# Patient Record
Sex: Female | Born: 1965 | Race: White | Hispanic: No | Smoking: Never smoker
Health system: Southern US, Community
[De-identification: ages and names within clinical notes are randomized; demographics above are authoritative.]

## PROBLEM LIST (undated history)

## (undated) DIAGNOSIS — E785 Hyperlipidemia, unspecified: Secondary | ICD-10-CM

## (undated) DIAGNOSIS — N809 Endometriosis, unspecified: Secondary | ICD-10-CM

## (undated) DIAGNOSIS — K769 Liver disease, unspecified: Secondary | ICD-10-CM

## (undated) DIAGNOSIS — D493 Neoplasm of unspecified behavior of breast: Secondary | ICD-10-CM

## (undated) DIAGNOSIS — E039 Hypothyroidism, unspecified: Secondary | ICD-10-CM

## (undated) DIAGNOSIS — Z973 Presence of spectacles and contact lenses: Secondary | ICD-10-CM

## (undated) DIAGNOSIS — Z862 Personal history of diseases of the blood and blood-forming organs and certain disorders involving the immune mechanism: Secondary | ICD-10-CM

## (undated) DIAGNOSIS — Z8489 Family history of other specified conditions: Secondary | ICD-10-CM

## (undated) DIAGNOSIS — E559 Vitamin D deficiency, unspecified: Secondary | ICD-10-CM

## (undated) DIAGNOSIS — K760 Fatty (change of) liver, not elsewhere classified: Secondary | ICD-10-CM

## (undated) DIAGNOSIS — K8020 Calculus of gallbladder without cholecystitis without obstruction: Secondary | ICD-10-CM

## (undated) DIAGNOSIS — G43909 Migraine, unspecified, not intractable, without status migrainosus: Secondary | ICD-10-CM

## (undated) DIAGNOSIS — G4733 Obstructive sleep apnea (adult) (pediatric): Secondary | ICD-10-CM

## (undated) HISTORY — DX: Neoplasm of unspecified behavior of breast: D49.3

## (undated) HISTORY — DX: Calculus of gallbladder without cholecystitis without obstruction: K80.20

## (undated) HISTORY — DX: Hyperlipidemia, unspecified: E78.5

## (undated) HISTORY — DX: Vitamin D deficiency, unspecified: E55.9

## (undated) HISTORY — DX: Fatty (change of) liver, not elsewhere classified: K76.0

## (undated) HISTORY — PX: VAGINAL HYSTERECTOMY: SUR661

## (undated) HISTORY — DX: Liver disease, unspecified: K76.9

## (undated) HISTORY — DX: Obstructive sleep apnea (adult) (pediatric): G47.33

## (undated) HISTORY — DX: Migraine, unspecified, not intractable, without status migrainosus: G43.909

## (undated) HISTORY — PX: EXPLORATORY LAPAROTOMY: SUR591

## (undated) HISTORY — DX: Endometriosis, unspecified: N80.9

---

## 1993-07-27 HISTORY — PX: APPENDECTOMY: SHX54

## 1999-07-28 HISTORY — PX: THYROIDECTOMY: SHX17

## 2007-07-28 HISTORY — PX: BREAST LUMPECTOMY: SHX2

## 2009-04-30 ENCOUNTER — Encounter: Admission: RE | Admit: 2009-04-30 | Discharge: 2009-04-30 | Payer: Self-pay | Admitting: Obstetrics and Gynecology

## 2009-06-04 ENCOUNTER — Encounter: Admission: RE | Admit: 2009-06-04 | Discharge: 2009-06-04 | Payer: Self-pay | Admitting: Surgery

## 2009-06-04 ENCOUNTER — Encounter (INDEPENDENT_AMBULATORY_CARE_PROVIDER_SITE_OTHER): Payer: Self-pay | Admitting: Diagnostic Radiology

## 2009-07-04 ENCOUNTER — Encounter (INDEPENDENT_AMBULATORY_CARE_PROVIDER_SITE_OTHER): Payer: Self-pay | Admitting: Obstetrics and Gynecology

## 2009-07-04 ENCOUNTER — Ambulatory Visit (HOSPITAL_COMMUNITY): Admission: RE | Admit: 2009-07-04 | Discharge: 2009-07-05 | Payer: Self-pay | Admitting: Obstetrics and Gynecology

## 2009-07-04 HISTORY — PX: LAPAROSCOPIC ASSISTED VAGINAL HYSTERECTOMY: SHX5398

## 2009-07-11 ENCOUNTER — Inpatient Hospital Stay (HOSPITAL_COMMUNITY): Admission: AD | Admit: 2009-07-11 | Discharge: 2009-07-14 | Payer: Self-pay | Admitting: Obstetrics and Gynecology

## 2010-01-31 ENCOUNTER — Emergency Department (HOSPITAL_BASED_OUTPATIENT_CLINIC_OR_DEPARTMENT_OTHER): Admission: EM | Admit: 2010-01-31 | Discharge: 2010-01-31 | Payer: Self-pay | Admitting: Emergency Medicine

## 2010-01-31 ENCOUNTER — Ambulatory Visit: Payer: Self-pay | Admitting: Diagnostic Radiology

## 2010-02-14 IMAGING — US US BIOPSY
1 series · 13 of 16 positions shown · non-contrast
Comparison: none

Addendum Begins

Pathology reveals a fibroadenoma and benign breast parenchyma in
the left breast. This was found to be concordant by Dr. Thrwat
Pettang. The patient reported that she had done well after the biopsy
without any bleeding, bruising, or tenderness. Post biopsy
instructions were given and the patient was encouraged to call The
She was asked to return in1 year to [REDACTED] for her
annual screening mammogram.
Pathology results are dictated by Magdolin Ben Ahmed RN, BSN on Danii
Addendum Ends
CLINICAL DATA: Mass in the 2 o'clock position of the left breast,
favored to represent a fibroadenoma.  The patient and physician
requests biopsy.

[Series 1: us biopsy · 13 of 16 slices shown]
[im 1/16]
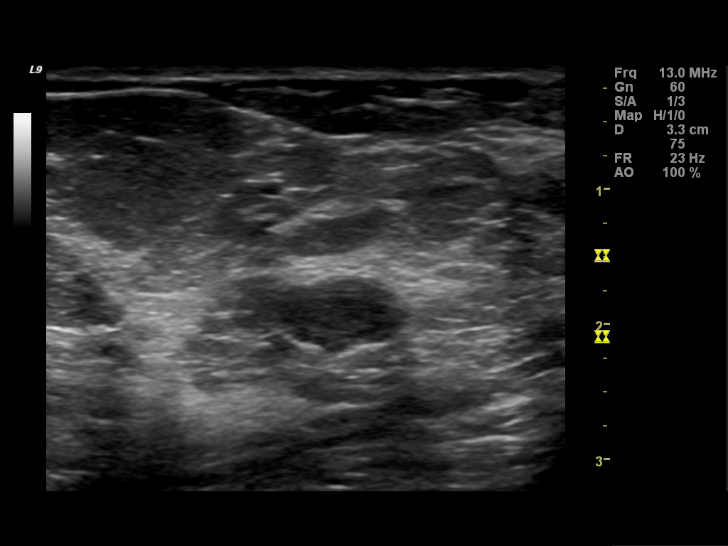
[im 2/16]
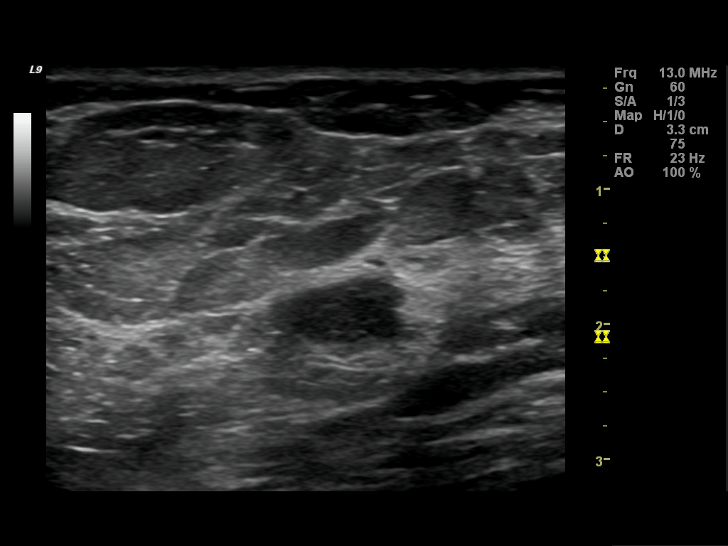
[im 4/16]
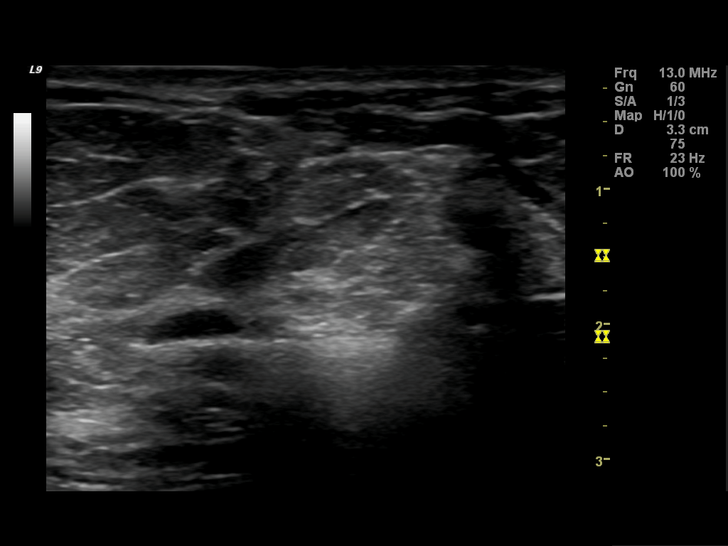
[im 5/16]
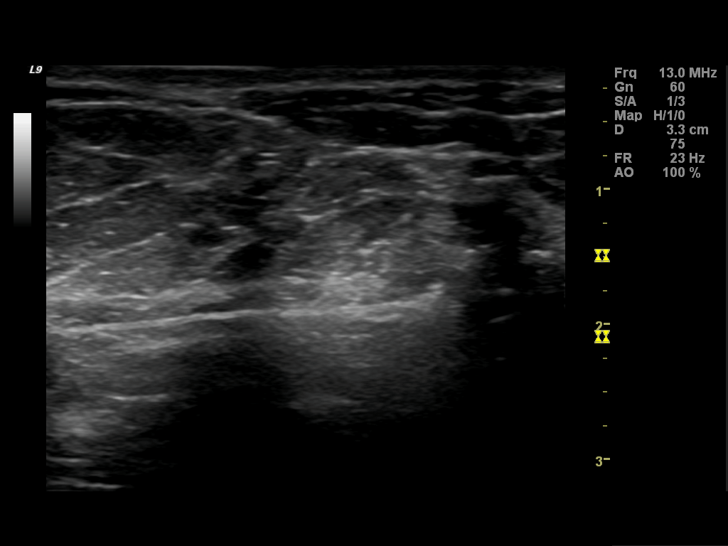
[im 6/16]
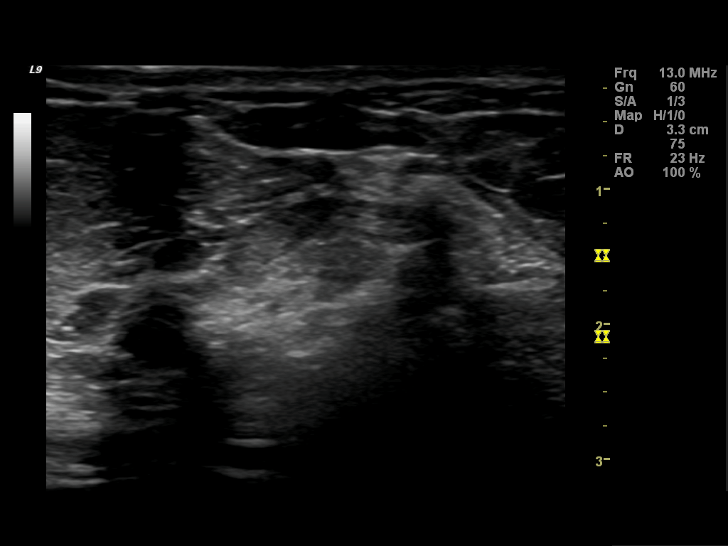
[im 7/16]
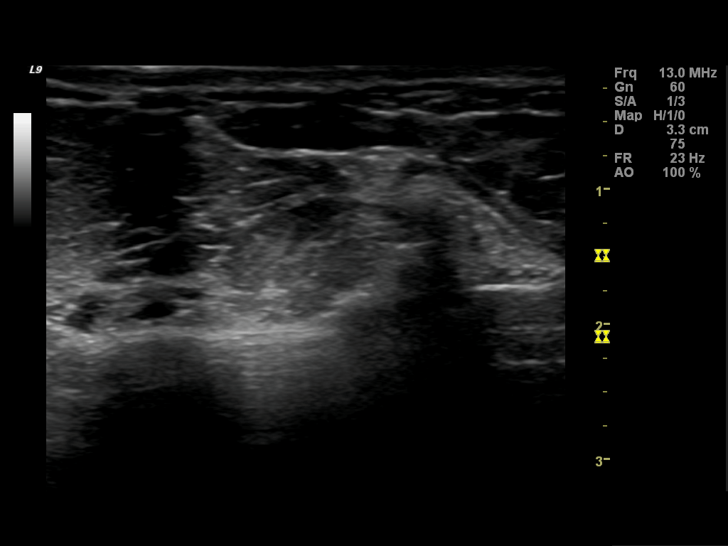
[im 9/16]
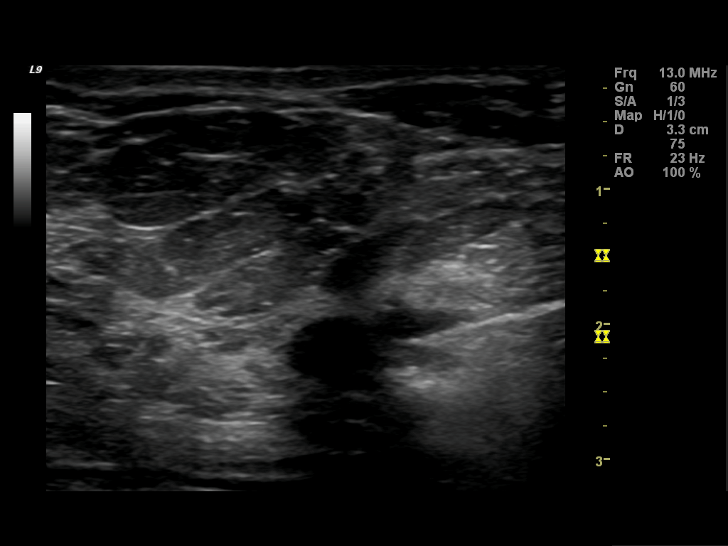
[im 10/16]
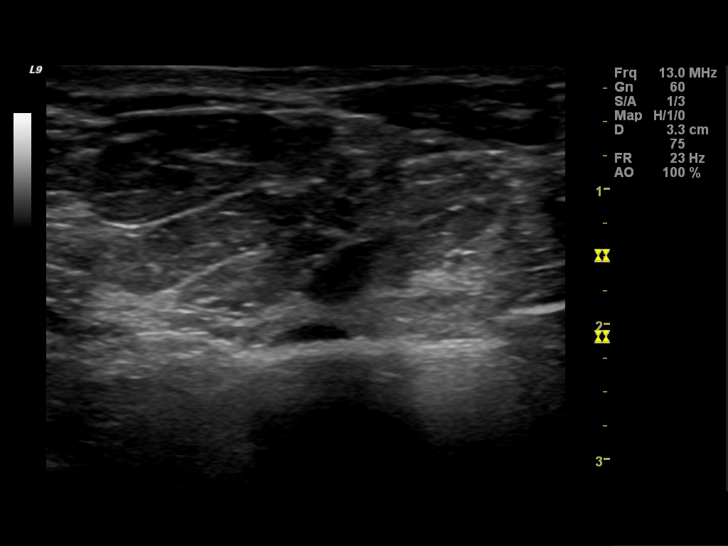
[im 11/16]
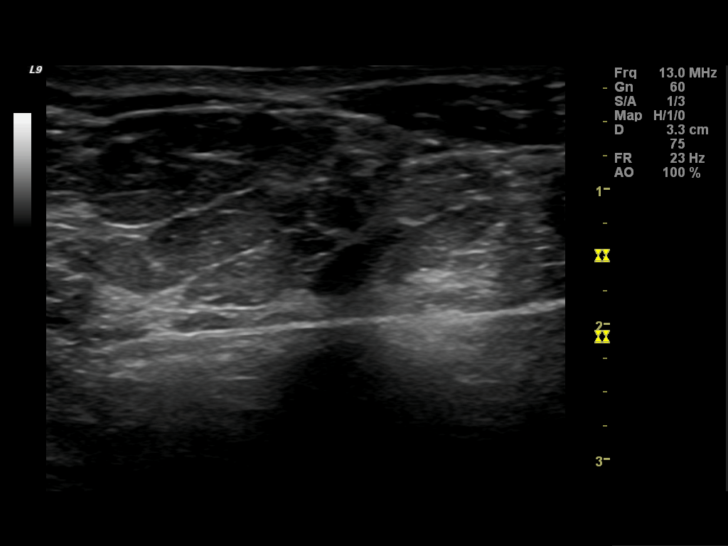
[im 12/16]
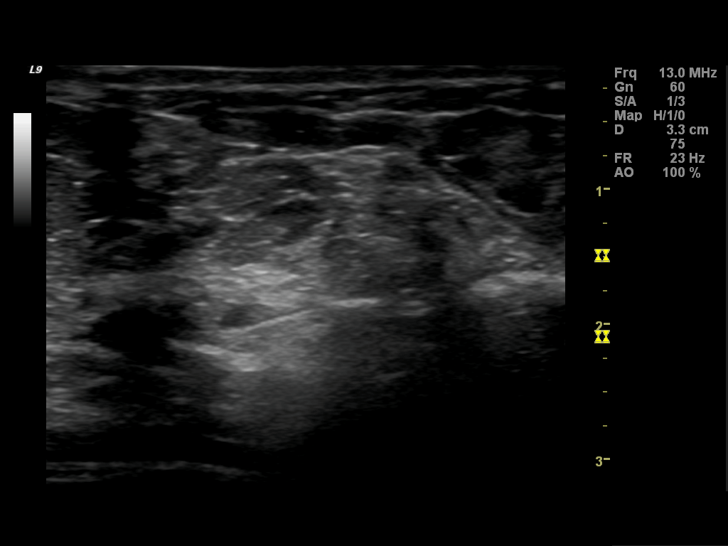
[im 13/16]
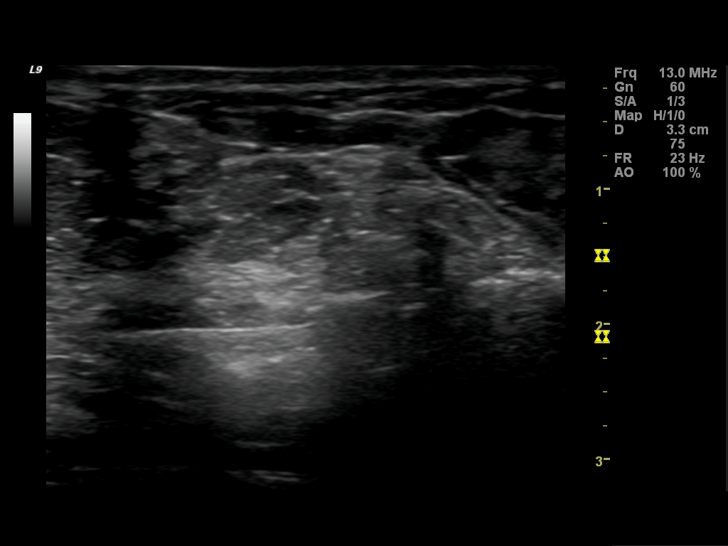
[im 15/16]
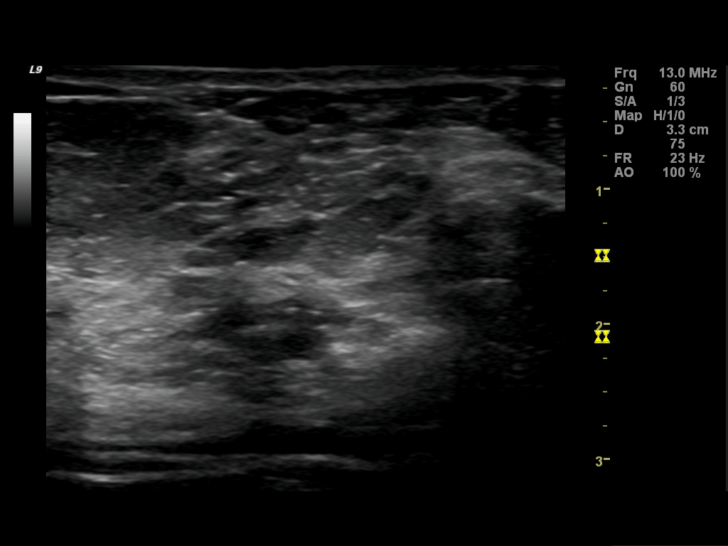
[im 16/16]
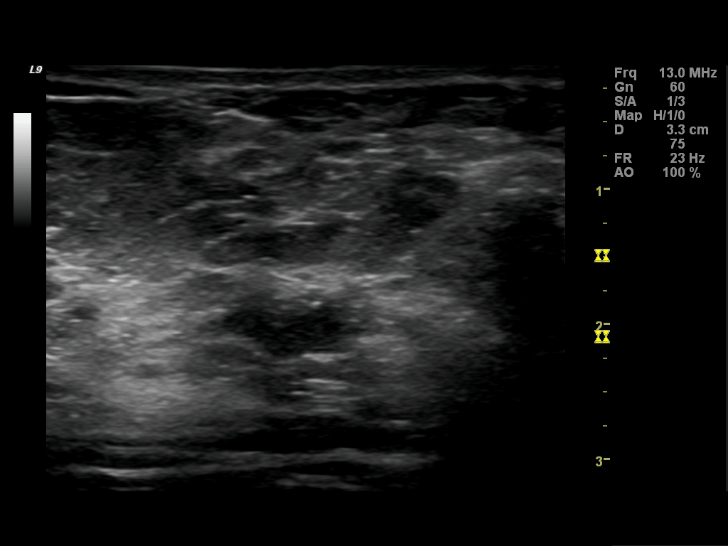

[13 of 16 positions shown; findings below may reference images not displayed]

ULTRASOUND GUIDED VACUUM ASSISTED CORE BIOPSY OF THE LEFT BREAST

I met with the patient, and we discussed the procedure of
ultrasound-guided biopsy, including risks, benefits, and
alternatives.  Specifically, we discussed the risks of infection,
bleeding, tissue injury, clip migration, and inadequate sampling.
Informed, written consent was given.

Using sterile technique, 2% lidocaine, ultrasound guidance, and a
12 gauge vacuum assisted needle, biopsy was performed of nodule in
the 2 o'clock position of the left breast, using a lateral
approach.  At the conclusion of the procedure, a tissue marker clip
was deployed into the biopsy cavity.  Follow-up 2-view mammogram
was performed and dictated separately.
IMPRESSION: Ultrasound-guided biopsy of nodule the 2 o'clock position left
breast.  No apparent complications.

## 2010-02-14 IMAGING — MG MM DIGITAL DIAGNOSTIC UNILAT*L*
2 series · 2 of 2 positions shown · non-contrast
Comparison: Pre biopsy mammogram and ultrasound

CLINICAL DATA: Status post ultrasound-guided core biopsy nodule in
the 2 o'clock position of the left breast.

DIGITAL DIAGNOSTIC LEFT MAMMOGRAM

[L CC]
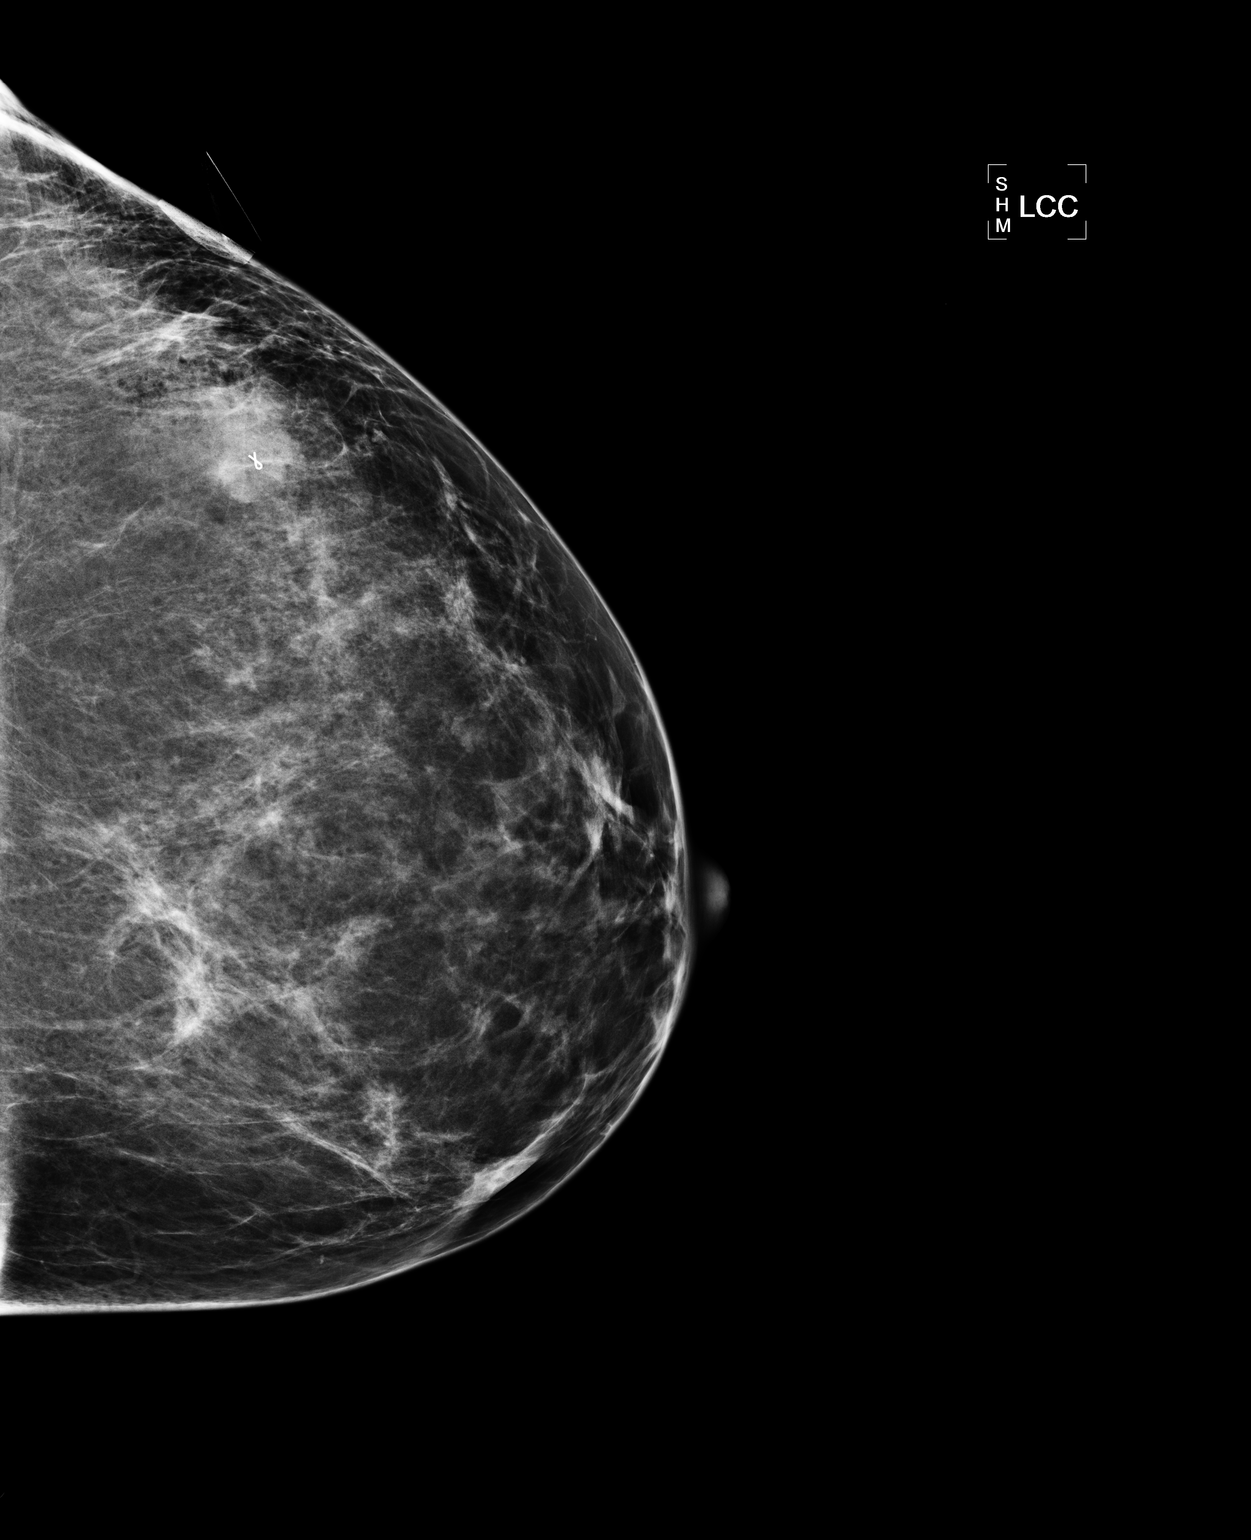

[L ML]
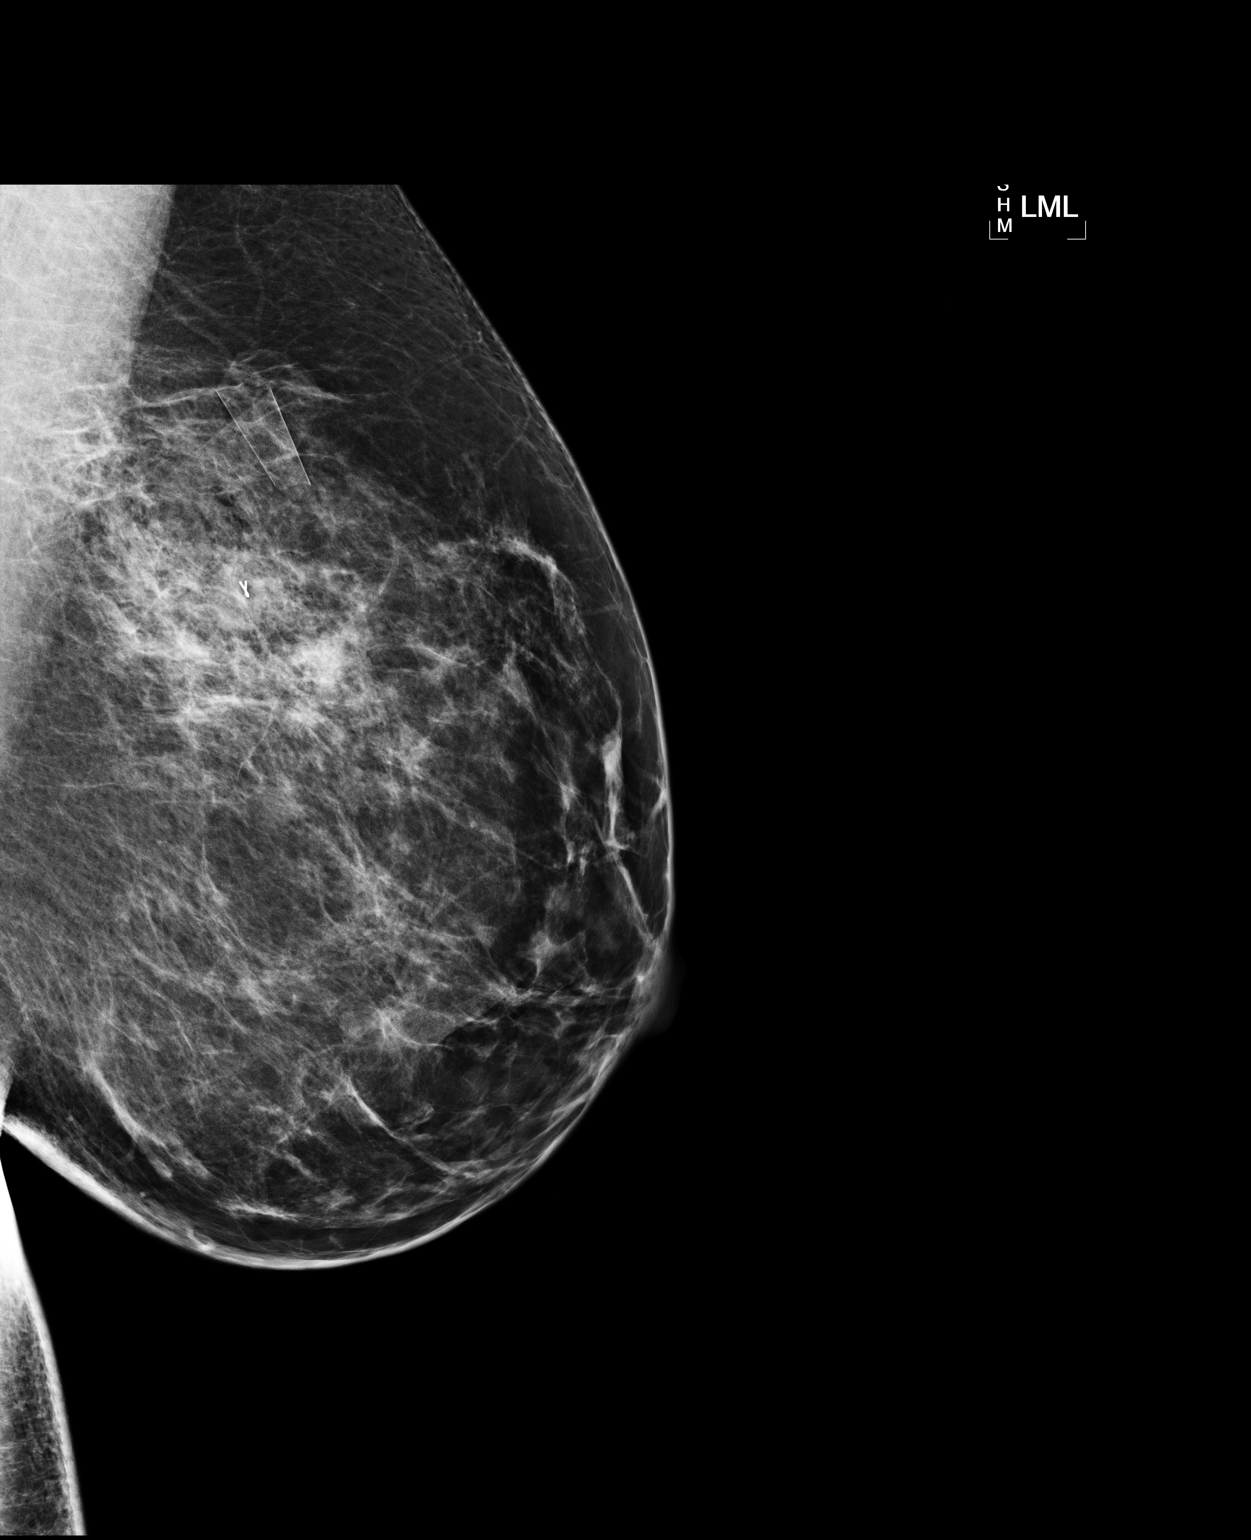

[2 of 2 positions shown; findings below may reference images not displayed]

FINDINGS: Films are performed following ultrasound guided biopsy
of nodule the 2 o'clock position of the left breast.  A ribbon
shaped clip is identified within the nodule the upper-outer
quadrant of the left breast, in the expected location following
biopsy.
IMPRESSION: Expected location of tissue marker clip.

## 2010-04-16 ENCOUNTER — Emergency Department (HOSPITAL_COMMUNITY): Admission: EM | Admit: 2010-04-16 | Discharge: 2010-04-16 | Payer: Self-pay | Admitting: Family Medicine

## 2010-04-30 ENCOUNTER — Encounter: Admission: RE | Admit: 2010-04-30 | Discharge: 2010-04-30 | Payer: Self-pay | Admitting: Surgery

## 2010-10-18 ENCOUNTER — Emergency Department (HOSPITAL_COMMUNITY)
Admission: EM | Admit: 2010-10-18 | Discharge: 2010-10-19 | Disposition: A | Discharge status: Home / Self Care | Payer: BC Managed Care – PPO | Attending: Emergency Medicine | Admitting: Emergency Medicine

## 2010-10-18 DIAGNOSIS — E039 Hypothyroidism, unspecified: Secondary | ICD-10-CM | POA: Insufficient documentation

## 2010-10-18 DIAGNOSIS — T148XXA Other injury of unspecified body region, initial encounter: Secondary | ICD-10-CM | POA: Insufficient documentation

## 2010-10-18 DIAGNOSIS — M79609 Pain in unspecified limb: Secondary | ICD-10-CM | POA: Insufficient documentation

## 2010-10-18 DIAGNOSIS — R269 Unspecified abnormalities of gait and mobility: Secondary | ICD-10-CM | POA: Insufficient documentation

## 2010-10-18 DIAGNOSIS — X500XXA Overexertion from strenuous movement or load, initial encounter: Secondary | ICD-10-CM | POA: Insufficient documentation

## 2010-10-27 LAB — COMPREHENSIVE METABOLIC PANEL
ALT: 18 U/L (ref 0–35)
AST: 15 U/L (ref 0–37)
Albumin: 3.4 g/dL — ABNORMAL LOW (ref 3.5–5.2)
Alkaline Phosphatase: 69 U/L (ref 39–117)
BUN: 11 mg/dL (ref 6–23)
CO2: 22 mEq/L (ref 19–32)
Calcium: 8.9 mg/dL (ref 8.4–10.5)
Chloride: 103 mEq/L (ref 96–112)
Creatinine, Ser: 0.87 mg/dL (ref 0.4–1.2)
GFR calc Af Amer: >60 mL/min (ref >60)
GFR calc non Af Amer: >60 mL/min (ref >60)
Glucose, Bld: 110 mg/dL — ABNORMAL HIGH (ref 70–99)
Potassium: 3.8 mEq/L (ref 3.5–5.1)
Sodium: 134 mEq/L — ABNORMAL LOW (ref 135–145)
Total Bilirubin: 1 mg/dL (ref 0.3–1.2)
Total Protein: 7.1 g/dL (ref 6.0–8.3)

## 2010-10-27 LAB — DIFFERENTIAL
Basophils Absolute: 0 10*3/uL (ref 0.0–0.1)
Basophils Absolute: 0 10*3/uL (ref 0.0–0.1)
Basophils Absolute: 0.1 10*3/uL (ref 0.0–0.1)
Basophils Relative: 0 % (ref 0–1)
Basophils Relative: 0 % (ref 0–1)
Basophils Relative: 0 % (ref 0–1)
Eosinophils Absolute: 0.1 10*3/uL (ref 0.0–0.7)
Eosinophils Absolute: 0.1 10*3/uL (ref 0.0–0.7)
Eosinophils Absolute: 0.2 10*3/uL (ref 0.0–0.7)
Eosinophils Relative: 1 % (ref 0–5)
Eosinophils Relative: 1 % (ref 0–5)
Eosinophils Relative: 2 % (ref 0–5)
Lymphocytes Relative: 18 % (ref 12–46)
Lymphocytes Relative: 4 % — ABNORMAL LOW (ref 12–46)
Lymphocytes Relative: 9 % — ABNORMAL LOW (ref 12–46)
Lymphs Abs: 0.9 10*3/uL (ref 0.7–4.0)
Lymphs Abs: 1.3 10*3/uL (ref 0.7–4.0)
Lymphs Abs: 1.7 10*3/uL (ref 0.7–4.0)
Monocytes Absolute: 0.9 10*3/uL (ref 0.1–1.0)
Monocytes Absolute: 1.3 10*3/uL — ABNORMAL HIGH (ref 0.1–1.0)
Monocytes Absolute: 1.5 10*3/uL — ABNORMAL HIGH (ref 0.1–1.0)
Monocytes Relative: 10 % (ref 3–12)
Monocytes Relative: 10 % (ref 3–12)
Monocytes Relative: 6 % (ref 3–12)
Neutro Abs: 11.8 10*3/uL — ABNORMAL HIGH (ref 1.7–7.7)
Neutro Abs: 18.8 10*3/uL — ABNORMAL HIGH (ref 1.7–7.7)
Neutro Abs: 6.6 10*3/uL (ref 1.7–7.7)
Neutrophils Relative %: 70 % (ref 43–77)
Neutrophils Relative %: 80 % — ABNORMAL HIGH (ref 43–77)
Neutrophils Relative %: 89 % — ABNORMAL HIGH (ref 43–77)

## 2010-10-27 LAB — URINALYSIS, ROUTINE W REFLEX MICROSCOPIC
Bilirubin Urine: NEGATIVE
Glucose, UA: NEGATIVE mg/dL
Ketones, ur: 40 mg/dL — AB
Leukocytes, UA: NEGATIVE
Nitrite: NEGATIVE
Protein, ur: NEGATIVE mg/dL
Specific Gravity, Urine: <1.005 — ABNORMAL LOW (ref 1.005–1.030)
Urobilinogen, UA: 0.2 mg/dL (ref 0.0–1.0)
pH: 5.5 (ref 5.0–8.0)

## 2010-10-27 LAB — D-DIMER, QUANTITATIVE (NOT AT ARMC): D-Dimer, Quant: 1.26 ug/mL-FEU — ABNORMAL HIGH (ref 0.00–0.48)

## 2010-10-27 LAB — CBC
HCT: 28.1 % — ABNORMAL LOW (ref 36.0–46.0)
HCT: 30 % — ABNORMAL LOW (ref 36.0–46.0)
HCT: 34.8 % — ABNORMAL LOW (ref 36.0–46.0)
Hemoglobin: 10 g/dL — ABNORMAL LOW (ref 12.0–15.0)
Hemoglobin: 11.6 g/dL — ABNORMAL LOW (ref 12.0–15.0)
Hemoglobin: 9.4 g/dL — ABNORMAL LOW (ref 12.0–15.0)
MCHC: 33.3 g/dL (ref 30.0–36.0)
MCHC: 33.3 g/dL (ref 30.0–36.0)
MCHC: 33.4 g/dL (ref 30.0–36.0)
MCV: 84.8 fL (ref 78.0–100.0)
MCV: 84.9 fL (ref 78.0–100.0)
MCV: 85.8 fL (ref 78.0–100.0)
Platelets: 348 10*3/uL (ref 150–400)
Platelets: 348 10*3/uL (ref 150–400)
Platelets: 391 10*3/uL (ref 150–400)
RBC: 3.31 MIL/uL — ABNORMAL LOW (ref 3.87–5.11)
RBC: 3.5 MIL/uL — ABNORMAL LOW (ref 3.87–5.11)
RBC: 4.1 MIL/uL (ref 3.87–5.11)
RDW: 14 % (ref 11.5–15.5)
RDW: 14.1 % (ref 11.5–15.5)
RDW: 14.3 % (ref 11.5–15.5)
WBC: 14.7 10*3/uL — ABNORMAL HIGH (ref 4.0–10.5)
WBC: 21.2 10*3/uL — ABNORMAL HIGH (ref 4.0–10.5)
WBC: 9.4 10*3/uL (ref 4.0–10.5)

## 2010-10-27 LAB — PROTIME-INR
INR: 1.06 (ref 0.00–1.49)
Prothrombin Time: 13.7 seconds (ref 11.6–15.2)

## 2010-10-27 LAB — CULTURE, BLOOD (ROUTINE X 2)
Culture: NO GROWTH
Culture: NO GROWTH

## 2010-10-27 LAB — URINE CULTURE
Colony Count: NO GROWTH
Culture: NO GROWTH

## 2010-10-27 LAB — BLOOD GAS, ARTERIAL
Acid-base deficit: 5 mmol/L — ABNORMAL HIGH (ref 0.0–2.0)
Bicarbonate: 17.2 mEq/L — ABNORMAL LOW (ref 20.0–24.0)
Drawn by: 139
FIO2: 0.21 %
TCO2: 17.9 mmol/L (ref 0–100)
pCO2 arterial: 24 mmHg — ABNORMAL LOW (ref 35.0–45.0)
pH, Arterial: 7.468 — ABNORMAL HIGH (ref 7.350–7.400)
pO2, Arterial: 32.9 mmHg — CL (ref 80.0–100.0)

## 2010-10-27 LAB — LACTIC ACID, PLASMA: Lactic Acid, Venous: 1 mmol/L (ref 0.5–2.2)

## 2010-10-27 LAB — TYPE AND SCREEN
ABO/RH(D): O POS
Antibody Screen: NEGATIVE

## 2010-10-27 LAB — ABO/RH: ABO/RH(D): O POS

## 2010-10-27 LAB — URINE MICROSCOPIC-ADD ON

## 2010-10-27 LAB — APTT: aPTT: 41 seconds — ABNORMAL HIGH (ref 24–37)

## 2010-10-28 LAB — T4: T4, Total: 16 ug/dL — ABNORMAL HIGH (ref 5.0–12.5)

## 2010-10-28 LAB — CBC
HCT: 32.9 % — ABNORMAL LOW (ref 36.0–46.0)
HCT: 40.2 % (ref 36.0–46.0)
Hemoglobin: 10.9 g/dL — ABNORMAL LOW (ref 12.0–15.0)
Hemoglobin: 13.3 g/dL (ref 12.0–15.0)
MCHC: 33.1 g/dL (ref 30.0–36.0)
MCHC: 33.2 g/dL (ref 30.0–36.0)
MCV: 85.7 fL (ref 78.0–100.0)
MCV: 86.8 fL (ref 78.0–100.0)
Platelets: 332 10*3/uL (ref 150–400)
Platelets: 384 10*3/uL (ref 150–400)
RBC: 3.8 MIL/uL — ABNORMAL LOW (ref 3.87–5.11)
RBC: 4.69 MIL/uL (ref 3.87–5.11)
RDW: 14.1 % (ref 11.5–15.5)
RDW: 14.6 % (ref 11.5–15.5)
WBC: 16.1 10*3/uL — ABNORMAL HIGH (ref 4.0–10.5)
WBC: 6.9 10*3/uL (ref 4.0–10.5)

## 2010-10-28 LAB — T3: T3, Total: 149.3 ng/dl (ref 80.0–204.0)

## 2010-10-28 LAB — TSH: TSH: 0.019 u[IU]/mL — ABNORMAL LOW (ref 0.350–4.500)

## 2010-10-28 LAB — VITAMIN D 1,25 DIHYDROXY
Vitamin D 1, 25 (OH)2 Total: 50 pg/mL (ref 18–72)
Vitamin D2 1, 25 (OH)2: 33 pg/mL
Vitamin D3 1, 25 (OH)2: 17 pg/mL

## 2012-07-27 DIAGNOSIS — K769 Liver disease, unspecified: Secondary | ICD-10-CM

## 2012-07-27 HISTORY — DX: Liver disease, unspecified: K76.9

## 2013-02-28 ENCOUNTER — Other Ambulatory Visit: Payer: Self-pay | Admitting: Orthopedic Surgery

## 2013-03-07 ENCOUNTER — Encounter (HOSPITAL_BASED_OUTPATIENT_CLINIC_OR_DEPARTMENT_OTHER): Payer: Self-pay | Admitting: *Deleted

## 2013-03-14 ENCOUNTER — Ambulatory Visit (HOSPITAL_BASED_OUTPATIENT_CLINIC_OR_DEPARTMENT_OTHER): Payer: Medicaid Other | Admitting: Certified Registered Nurse Anesthetist

## 2013-03-14 ENCOUNTER — Encounter (HOSPITAL_BASED_OUTPATIENT_CLINIC_OR_DEPARTMENT_OTHER): Payer: Self-pay | Admitting: Certified Registered Nurse Anesthetist

## 2013-03-14 ENCOUNTER — Encounter (HOSPITAL_BASED_OUTPATIENT_CLINIC_OR_DEPARTMENT_OTHER)
Admission: RE | Disposition: A | Discharge status: Home / Self Care | Payer: Self-pay | Source: Ambulatory Visit | Attending: Orthopedic Surgery

## 2013-03-14 ENCOUNTER — Ambulatory Visit (HOSPITAL_BASED_OUTPATIENT_CLINIC_OR_DEPARTMENT_OTHER)
Admission: RE | Admit: 2013-03-14 | Discharge: 2013-03-14 | Disposition: A | Discharge status: Home / Self Care | Payer: Medicaid Other | Source: Ambulatory Visit | Attending: Orthopedic Surgery | Admitting: Orthopedic Surgery

## 2013-03-14 DIAGNOSIS — D693 Immune thrombocytopenic purpura: Secondary | ICD-10-CM | POA: Insufficient documentation

## 2013-03-14 DIAGNOSIS — Z91013 Allergy to seafood: Secondary | ICD-10-CM | POA: Insufficient documentation

## 2013-03-14 DIAGNOSIS — M778 Other enthesopathies, not elsewhere classified: Secondary | ICD-10-CM | POA: Insufficient documentation

## 2013-03-14 DIAGNOSIS — Z885 Allergy status to narcotic agent status: Secondary | ICD-10-CM | POA: Insufficient documentation

## 2013-03-14 DIAGNOSIS — Z91041 Radiographic dye allergy status: Secondary | ICD-10-CM | POA: Insufficient documentation

## 2013-03-14 DIAGNOSIS — G5600 Carpal tunnel syndrome, unspecified upper limb: Secondary | ICD-10-CM | POA: Insufficient documentation

## 2013-03-14 DIAGNOSIS — E039 Hypothyroidism, unspecified: Secondary | ICD-10-CM | POA: Insufficient documentation

## 2013-03-14 HISTORY — DX: Personal history of diseases of the blood and blood-forming organs and certain disorders involving the immune mechanism: Z86.2

## 2013-03-14 HISTORY — DX: Family history of other specified conditions: Z84.89

## 2013-03-14 HISTORY — PX: CARPAL TUNNEL RELEASE: SHX101

## 2013-03-14 HISTORY — DX: Hypothyroidism, unspecified: E03.9

## 2013-03-14 LAB — POCT HEMOGLOBIN-HEMACUE: Hemoglobin: 13.8 g/dL (ref 12.0–15.0)

## 2013-03-14 SURGERY — CARPAL TUNNEL RELEASE
Anesthesia: General | Site: Wrist | Laterality: Right | Wound class: Clean

## 2013-03-14 MED ORDER — OXYCODONE-ACETAMINOPHEN 5-325 MG PO TABS: ORAL_TABLET | ORAL | Status: DC

## 2013-03-14 MED ORDER — OXYCODONE HCL 5 MG/5ML PO SOLN: 5.0000 mg | Freq: Once | ORAL | Status: AC | PRN

## 2013-03-14 MED ORDER — MIDAZOLAM HCL 2 MG/2ML IJ SOLN: 1.0000 mg | INTRAMUSCULAR | Status: DC | PRN

## 2013-03-14 MED ORDER — PROPOFOL 10 MG/ML IV BOLUS
INTRAVENOUS | Status: DC | PRN
  Administered 2013-03-14: 200 mg via INTRAVENOUS

## 2013-03-14 MED ORDER — FENTANYL CITRATE 0.05 MG/ML IJ SOLN: 50.0000 ug | INTRAMUSCULAR | Status: DC | PRN

## 2013-03-14 MED ORDER — DEXAMETHASONE SODIUM PHOSPHATE 10 MG/ML IJ SOLN
INTRAMUSCULAR | Status: DC | PRN
  Administered 2013-03-14: 10 mg via INTRAVENOUS

## 2013-03-14 MED ORDER — OXYCODONE HCL 5 MG PO TABS
5.0000 mg | ORAL_TABLET | Freq: Once | ORAL | Status: AC | PRN
  Administered 2013-03-14: 5 mg via ORAL

## 2013-03-14 MED ORDER — CHLORHEXIDINE GLUCONATE 4 % EX LIQD: 60.0000 mL | Freq: Once | CUTANEOUS | Status: DC

## 2013-03-14 MED ORDER — LIDOCAINE HCL (CARDIAC) 20 MG/ML IV SOLN
INTRAVENOUS | Status: DC | PRN
  Administered 2013-03-14: 60 mg via INTRAVENOUS

## 2013-03-14 MED ORDER — MIDAZOLAM HCL 2 MG/ML PO SYRP: 12.0000 mg | ORAL_SOLUTION | Freq: Once | ORAL | Status: DC | PRN

## 2013-03-14 MED ORDER — FENTANYL CITRATE 0.05 MG/ML IJ SOLN
INTRAMUSCULAR | Status: DC | PRN
  Administered 2013-03-14: 50 ug via INTRAVENOUS
  Administered 2013-03-14: 25 ug via INTRAVENOUS
  Administered 2013-03-14: 50 ug via INTRAVENOUS

## 2013-03-14 MED ORDER — HYDROMORPHONE HCL PF 1 MG/ML IJ SOLN
0.2500 mg | INTRAMUSCULAR | Status: DC | PRN
  Administered 2013-03-14 (×2): 0.5 mg via INTRAVENOUS

## 2013-03-14 MED ORDER — ONDANSETRON HCL 4 MG/2ML IJ SOLN
INTRAMUSCULAR | Status: DC | PRN
  Administered 2013-03-14: 4 mg via INTRAVENOUS

## 2013-03-14 MED ORDER — LIDOCAINE HCL 2 % IJ SOLN
INTRAMUSCULAR | Status: DC | PRN
  Administered 2013-03-14: 4 mL

## 2013-03-14 MED ORDER — MIDAZOLAM HCL 5 MG/5ML IJ SOLN
INTRAMUSCULAR | Status: DC | PRN
  Administered 2013-03-14: 1 mg via INTRAVENOUS

## 2013-03-14 MED ORDER — LACTATED RINGERS IV SOLN
INTRAVENOUS | Status: DC
  Administered 2013-03-14 (×2): via INTRAVENOUS

## 2013-03-14 SURGICAL SUPPLY — 37 items
BANDAGE ADHESIVE 1X3 (GAUZE/BANDAGES/DRESSINGS) IMPLANT
BANDAGE ELASTIC 3 VELCRO ST LF (GAUZE/BANDAGES/DRESSINGS) ×2 IMPLANT
BLADE SURG 15 STRL LF DISP TIS (BLADE) ×1 IMPLANT
BLADE SURG 15 STRL SS (BLADE) ×1
BNDG CMPR 9X4 STRL LF SNTH (GAUZE/BANDAGES/DRESSINGS) ×1
BNDG ESMARK 4X9 LF (GAUZE/BANDAGES/DRESSINGS) ×2 IMPLANT
BRUSH SCRUB EZ PLAIN DRY (MISCELLANEOUS) ×2 IMPLANT
CLOTH BEACON ORANGE TIMEOUT ST (SAFETY) ×2 IMPLANT
CORDS BIPOLAR (ELECTRODE) ×2 IMPLANT
COVER MAYO STAND STRL (DRAPES) ×2 IMPLANT
COVER TABLE BACK 60X90 (DRAPES) ×2 IMPLANT
CUFF TOURNIQUET SINGLE 18IN (TOURNIQUET CUFF) ×2 IMPLANT
DECANTER SPIKE VIAL GLASS SM (MISCELLANEOUS) ×2 IMPLANT
DRAPE EXTREMITY T 121X128X90 (DRAPE) ×2 IMPLANT
DRAPE SURG 17X23 STRL (DRAPES) ×2 IMPLANT
GLOVE BIO SURGEON STRL SZ 6.5 (GLOVE) ×2 IMPLANT
GLOVE BIOGEL M STRL SZ7.5 (GLOVE) IMPLANT
GLOVE ORTHO TXT STRL SZ7.5 (GLOVE) ×2 IMPLANT
GOWN BRE IMP PREV XXLGXLNG (GOWN DISPOSABLE) ×2 IMPLANT
GOWN PREVENTION PLUS XLARGE (GOWN DISPOSABLE) ×2 IMPLANT
NEEDLE 27GAX1X1/2 (NEEDLE) ×2 IMPLANT
PACK BASIN DAY SURGERY FS (CUSTOM PROCEDURE TRAY) ×2 IMPLANT
PAD CAST 3X4 CTTN HI CHSV (CAST SUPPLIES) ×1 IMPLANT
PADDING CAST ABS 4INX4YD NS (CAST SUPPLIES) ×1
PADDING CAST ABS COTTON 4X4 ST (CAST SUPPLIES) ×1 IMPLANT
PADDING CAST COTTON 3X4 STRL (CAST SUPPLIES) ×1
SPLINT PLASTER CAST XFAST 3X15 (CAST SUPPLIES) ×5 IMPLANT
SPLINT PLASTER XTRA FASTSET 3X (CAST SUPPLIES) ×5
SPONGE GAUZE 4X4 12PLY (GAUZE/BANDAGES/DRESSINGS) ×2 IMPLANT
STOCKINETTE 4X48 STRL (DRAPES) ×2 IMPLANT
STRIP CLOSURE SKIN 1/2X4 (GAUZE/BANDAGES/DRESSINGS) ×2 IMPLANT
SUT PROLENE 3 0 PS 2 (SUTURE) ×2 IMPLANT
SYR 3ML 23GX1 SAFETY (SYRINGE) IMPLANT
SYR CONTROL 10ML LL (SYRINGE) ×2 IMPLANT
TOWEL OR 17X24 6PK STRL BLUE (TOWEL DISPOSABLE) ×2 IMPLANT
TRAY DSU PREP LF (CUSTOM PROCEDURE TRAY) ×2 IMPLANT
UNDERPAD 30X30 INCONTINENT (UNDERPADS AND DIAPERS) ×2 IMPLANT

## 2013-03-14 NOTE — H&P (Deleted)
Miranda [name], [name]                 ACCOUNT NO.:  [id number]  MEDICAL RECORD NO.:  [id number]  LOCATION:                               FACILITY:  [facility]  PHYSICIAN:  Katy Fitch. Joeanthony Seeling, M.D. DATE OF BIRTH:  November 07, 1965  DATE OF ADMISSION:  03/14/2013 DATE OF DISCHARGE:  03/14/2013                             HISTORY & PHYSICAL   PREOPERATIVE DIAGNOSES:  Chronic median entrapment neuropathy, right carpal tunnel rule out possible cyst adjacent to flexor pollicis longus, with positive electrodiagnostic studies preoperatively documenting carpal tunnel syndrome.  POSTOPERATIVE DIAGNOSES:  Chronic median entrapment neuropathy, right carpal tunnel rule out possible cyst adjacent to flexor pollicis longus, with positive electrodiagnostic studies preoperatively documenting carpal tunnel syndrome.  OPERATION:  Exploration of right carpal canal identifying carpal tunnel syndrome and hypertrophic bursa.  No evidence of a myxoid cyst.  OPERATING SURGEON:  Katy Fitch. Batul Diego, M.D.  ASSISTANT:  Surgical technician.  ANESTHESIA:  General by LMA.  SUPERVISING ANESTHESIOLOGIST:  Zenon Mayo, MD.  INDICATIONS:  Miranda [name] is a 47 year old woman referred through the courtesy of Miranda family for evaluation and management of hand numbness and pain.  Clinical examination suggested carpal tunnel syndrome and a possible space-occupying lesion at the wrist.  Due to a persistent symptoms, that did not respond to anti-inflammatory medication and steroid injection.  We sent Miranda for an MRI.  The MRI revealed a enhancing median nerve suggestive of swelling.  There was some fullness near the flexor pollicis longus which I questioned might be a myxoid cyst.  The attending radiologist felt that this was a unusually placed median nerve that was quite edematous.  We advised expiation of the carpal canal.  She presents for more definitive care at this time. Preoperatively, we reviewed Miranda multiple drug  allergies which included iodinated contrast agent, shellfish, and Demerol.  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  Questions were invited and answered in detail with Miranda [name] and Miranda [name], who was present with Miranda in the holding area preoperatively.  Dr. Sampson Goon detailed anesthesia choices.  General anesthesia by LMA technique was recommended and accepted.  After informed consent, she is brought to the operating room at this time.  PROCEDURE:  Miranda [name] was brought to room 2 of the [facility] and placed in a supine position on the operating table.  Following the induction of general anesthesia by LMA technique, the right hand and arm were prepped with DuraPrep due to a iodinated agent allergy.  A pneumatic tourniquet was applied to the proximal right brachium.  Sterile stockinette and impervious arthroscopy drapes were applied, followed by exsanguination of the right hand and arm with an Esmarch bandage and an inflation of the arterial tourniquet to 220 mmHg.  A routine surgical time-out was accomplished, followed by the procedure.  A short incision was fashioned in line of the ring finger and the palm. Subcutaneous tissues were carefully divided revealing the palmar fascia. This split longitudinally to the common sensory branch of the median nerve.  These were followed back to the transverse carpal ligament.  Miranda [name] was noted to have very typical hypothenar and  thenar musculature. The hypothenar muscles and thenar muscles interdigitated with layers of fascia.  Great care was taken to sound the canal with a Penfield 4 elevator, followed by teasing the muscle fibers apart looking for aberrant motor branches.  None were identified.  Ultimately, we proceeded with a sequential release of the transverse carpal ligament using tenotomy scissors into the distal forearm.  The volar forearm fascia was released subcutaneously.  This  widely opened the carpal canal.  The ulnar bursa was very thick and fibrotic and opaque.  With great care, a Penfield 4 elevator was used to explore between the tendons of the carpal canal, including retraction of the median nerve gently in an ulnar direction.  The flexor pollicis longus was inspected and no cyst or other mass was identified deep to the nerve.  It appeared that what we had seen on the MRI was indeed a very edematous median nerve, which was more radial and deeper than usual.  The wound was inspected for bleeding points which were electrocauterized bipolar current, followed by repair of the skin with intradermal 3-0 Prolene suture.  Steri-Strips was applied, followed by infiltration of the wound margin with 2% lidocaine for postoperative comfort.  The tourniquet was released with immediate capillary refill.  Miranda [name] was placed in a compressive dressing with a volar plaster splint maintaining the wrist in 15 degrees dorsiflexion.     Katy Fitch Galileah Piggee, M.D.   ______________________________ Katy Fitch. Sadie Pickar, M.D.    RVS/MEDQ  D:  03/14/2013  T:  03/14/2013  Job:  [id number]

## 2013-03-14 NOTE — Brief Op Note (Signed)
03/14/2013  9:20 AM  PATIENT:  Miranda [name]  47 y.o. female  PRE-OPERATIVE DIAGNOSIS:  RIGHT CARPAL TUNNEL SYNDROME, RULE OUT CYST @ CARPAL  POST-OPERATIVE DIAGNOSIS:  right carpal tunnel syndrome / bursitis  PROCEDURE:  Procedure(s): EXPLORE CANAL RIGHT CARPAL (Right)  SURGEON:  Surgeon(s) and Role:    * Wyn Forster., MD - Primary  PHYSICIAN ASSISTANT:   ASSISTANTS: Surgical technician  ANESTHESIA:   general  EBL:     BLOOD ADMINISTERED:none  DRAINS: none   LOCAL MEDICATIONS USED:  XYLOCAINE   SPECIMEN:  No Specimen  DISPOSITION OF SPECIMEN:  N/A  COUNTS:  YES  TOURNIQUET:   Total Tourniquet Time Documented: Upper Arm (Right) - 14 minutes Total: Upper Arm (Right) - 14 minutes   DICTATION: .Other Dictation: Dictation Number [id number]  PLAN OF CARE: Discharge to home after PACU  PATIENT DISPOSITION:  PACU - hemodynamically stable.   Delay start of Pharmacological VTE agent (>24hrs) due to surgical blood loss or risk of bleeding: not applicable

## 2013-03-14 NOTE — Op Note (Signed)
[id number]

## 2013-03-14 NOTE — H&P (Signed)
Miranda [name] is an 47 y.o. female.   Chief Complaint: right hand numbness HPI: 47 year old woman with right hand numbness.  Positive NCV documenting right carpal tunnel syndrome.  Past Medical History  Diagnosis Date  . History of idiopathic thrombocytopenic purpura     age 51 - no problems since  . Hypothyroidism   . Personal history of goiter   . 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 Family history of anesthesia complication     pt's mother has hx. of being hard to wake up post-op  . Dental crown present     Past Surgical History  Procedure Laterality Date  . Cesarean section      twins  . Thyroidectomy    . Breast lumpectomy Left   . Exploratory laparotomy    . Appendectomy      open appy.  . Laparoscopic assisted vaginal hysterectomy  07/04/2009    Family History  Problem Relation Age of Onset  . Anesthesia problems Mother     hard to wake up post-op   Social History:  reports that she has never smoked. She has never used smokeless tobacco. She reports that she does not drink alcohol or use illicit drugs.  Allergies:  Allergies  Allergen Reactions  . Iodinated Diagnostic Agents Anaphylaxis  . Shellfish Allergy Anaphylaxis  . Demerol [Meperidine] Other (See Comments)    HALLUCINATIONS    No prescriptions prior to admission    No results found for this or any previous visit (from the past 48 hour(s)). No results found.  Review of Systems  Constitutional: Negative.   HENT: Negative.   Eyes: Negative.   Respiratory: Negative.   Cardiovascular: Negative.   Gastrointestinal: Negative.   Genitourinary: Negative.   Musculoskeletal:       Episodic numbness of right hand in median innervated fingers.  MRI shos swollen median nerve and possible synovial cyst of carpal canal near FPL.  Skin: Negative.   Neurological: Negative.   Endo/Heme/Allergies:       History of ITP 1970.  Psychiatric/Behavioral: Negative.     Height 5\' 7"  (1.702 m), weight  90.719 kg (200 lb). Physical Exam  Constitutional: She is oriented to person, place, and time. She appears well-developed and well-nourished.  HENT:  Head: Normocephalic and atraumatic.  Eyes: EOM are normal. Pupils are equal, round, and reactive to light.  Neck: Normal range of motion. Neck supple.  Cardiovascular: Normal rate, regular rhythm and normal heart sounds.   Respiratory: Effort normal and breath sounds normal.  GI: Soft. Bowel sounds are normal.  Musculoskeletal: Normal range of motion.  Neurological: She is alert and oriented to person, place, and time.  Numbness in median innervated fingers right hand. NCV 12/21/12 revealed right median neuropathy. No response to injection or anti inflammatory med course.  Skin: Skin is warm and dry.  Psychiatric: She has a normal mood and affect. Her behavior is normal. Judgment and thought content normal.     Assessment/Plan: Right carpal tunnel syndrome.  Right carpal tunnel release.  Surgery ,aftercare, risks and benefits detailed.   Questions invited and answered.  Ayauna Mcnay JR,Heidee Audi V 03/14/2013, 6:24 AM

## 2013-03-14 NOTE — Anesthesia Preprocedure Evaluation (Addendum)
Anesthesia Evaluation  Patient identified by MRN, date of birth, ID band Patient awake    Reviewed: Allergy & Precautions, NPO status   Airway Mallampati: II TM Distance: >3 FB Neck ROM: Full    Dental  (+) Teeth Intact and Dental Advisory Given   Pulmonary  breath sounds clear to auscultation        Cardiovascular Rhythm:Regular Rate:Normal     Neuro/Psych  Neuromuscular disease    GI/Hepatic   Endo/Other  Hypothyroidism   Renal/GU      Musculoskeletal   Abdominal   Peds  Hematology   Anesthesia Other Findings   Reproductive/Obstetrics                          Anesthesia Physical Anesthesia Plan  ASA: II  Anesthesia Plan: General   Post-op Pain Management:    Induction: Intravenous  Airway Management Planned: LMA  Additional Equipment:   Intra-op Plan:   Post-operative Plan: Extubation in OR  Informed Consent: I have reviewed the patients History and Physical, chart, labs and discussed the procedure including the risks, benefits and alternatives for the proposed anesthesia with the patient or authorized representative who has indicated his/her understanding and acceptance.   Dental advisory given  Plan Discussed with: CRNA  Anesthesia Plan Comments:         Anesthesia Quick Evaluation

## 2013-03-14 NOTE — Anesthesia Postprocedure Evaluation (Signed)
  Anesthesia Post-op Note  Patient: Miranda [name]  Procedure(s) Performed: Procedure(s): EXPLORE CANAL RIGHT CARPAL (Right)  Patient Location: PACU  Anesthesia Type:General  Level of Consciousness: awake and alert   Airway and Oxygen Therapy: Patient Spontanous Breathing  Post-op Pain: mild  Post-op Assessment: Post-op Vital signs reviewed, Patient's Cardiovascular Status Stable, Respiratory Function Stable, Patent Airway and No signs of Nausea or vomiting  Post-op Vital Signs: Reviewed and stable  Complications: No apparent anesthesia complications

## 2013-03-14 NOTE — Anesthesia Procedure Notes (Signed)
Procedure Name: LMA Insertion Date/Time: 03/14/2013 8:53 AM Performed by: Deniah Saia D Pre-anesthesia Checklist: Patient identified, Emergency Drugs available, Suction available and Patient being monitored Patient Re-evaluated:Patient Re-evaluated prior to inductionOxygen Delivery Method: Circle System Utilized Preoxygenation: Pre-oxygenation with 100% oxygen Intubation Type: IV induction Ventilation: Mask ventilation without difficulty LMA: LMA inserted LMA Size: 4.0 Number of attempts: 1 Airway Equipment and Method: bite block Placement Confirmation: positive ETCO2 Tube secured with: Tape Dental Injury: Teeth and Oropharynx as per pre-operative assessment

## 2013-03-14 NOTE — Transfer of Care (Signed)
Immediate Anesthesia Transfer of Care Note  Patient: Miranda [name]  Procedure(s) Performed: Procedure(s): EXPLORE CANAL RIGHT CARPAL (Right)  Patient Location: PACU  Anesthesia Type:General  Level of Consciousness: awake, alert , oriented and patient cooperative  Airway & Oxygen Therapy: Patient Spontanous Breathing and Patient connected to face mask oxygen  Post-op Assessment: Report given to PACU RN and Post -op Vital signs reviewed and stable  Post vital signs: Reviewed and stable  Complications: No apparent anesthesia complications

## 2013-03-15 ENCOUNTER — Encounter (HOSPITAL_BASED_OUTPATIENT_CLINIC_OR_DEPARTMENT_OTHER): Payer: Self-pay | Admitting: Orthopedic Surgery

## 2013-03-16 NOTE — Op Note (Signed)
[name], [name]                 ACCOUNT NO.:  [id number]  MEDICAL RECORD NO.:  [id number]  LOCATION:                               FACILITY:  [facility]  PHYSICIAN:  Katy Fitch. Dema Timmons, M.D. DATE OF BIRTH:  1966/03/27  DATE OF PROCEDURE:  03/14/2013 DATE OF DISCHARGE:  03/14/2013                              OPERATIVE REPORT   PREOPERATIVE DIAGNOSES:  Chronic median entrapment neuropathy, right carpal tunnel rule out possible cyst adjacent to flexor pollicis longus, with positive electrodiagnostic studies preoperatively documenting carpal tunnel syndrome.  POSTOPERATIVE DIAGNOSES:  Chronic median entrapment neuropathy, right carpal tunnel rule out possible cyst adjacent to flexor pollicis longus, with positive electrodiagnostic studies preoperatively documenting carpal tunnel syndrome.  OPERATION:  Exploration of right carpal canal identifying carpal tunnel syndrome and hypertrophic bursa.  No evidence of a myxoid cyst.  OPERATING SURGEON:  Katy Fitch. Zenna Traister, M.D.  ASSISTANT:  Surgical technician.  ANESTHESIA:  General by LMA.  SUPERVISING ANESTHESIOLOGIST:  Zenon Mayo, MD.  INDICATIONS:  Miranda [name] is a 47 year old woman referred through the courtesy of her family for evaluation and management of hand numbness and pain.  Clinical examination suggested carpal tunnel syndrome and a possible space-occupying lesion at the wrist.  Due to a persistent symptoms, that did not respond to anti-inflammatory medication and steroid injection.  We sent her for an MRI.  The MRI revealed a enhancing median nerve suggestive of swelling.  There was some fullness near the flexor pollicis longus which I questioned might be a myxoid cyst.  The attending radiologist felt that this was a unusually placed median nerve that was quite edematous.  We advised expiation of the carpal canal.  She presents for more definitive care at this time. Preoperatively, we reviewed her multiple drug  allergies which included iodinated contrast agent, shellfish, and Demerol.  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  Questions were invited and answered in detail with Miranda [name] and her mother, who was present with her in the holding area preoperatively.  Dr. Sampson Goon detailed anesthesia choices.  General anesthesia by LMA technique was recommended and accepted.  After informed consent, she is brought to the operating room at this time.  PROCEDURE:  Miranda [name] was brought to room 2 of the [facility] and placed in a supine position on the operating table.  Following the induction of general anesthesia by LMA technique, the right hand and arm were prepped with DuraPrep due to a iodinated agent allergy.  A pneumatic tourniquet was applied to the proximal right brachium.  Sterile stockinette and impervious arthroscopy drapes were applied, followed by exsanguination of the right hand and arm with an Esmarch bandage and an inflation of the arterial tourniquet to 220 mmHg.  A routine surgical time-out was accomplished, followed by the procedure.  A short incision was fashioned in line of the ring finger and the palm. Subcutaneous tissues were carefully divided revealing the palmar fascia. This split longitudinally to the common sensory branch of the median nerve.  These were followed back to the transverse carpal ligament.  Miranda [name] was noted to have very typical hypothenar and  thenar musculature. The hypothenar muscles and thenar muscles interdigitated with layers of fascia.  Great care was taken to sound the canal with a Penfield 4 elevator, followed by teasing the muscle fibers apart looking for aberrant motor branches.  None were identified.  Ultimately, we proceeded with a sequential release of the transverse carpal ligament using tenotomy scissors into the distal forearm.  The volar forearm fascia was released subcutaneously.  This  widely opened the carpal canal.  The ulnar bursa was very thick and fibrotic and opaque.  With great care, a Penfield 4 elevator was used to explore between the tendons of the carpal canal, including retraction of the median nerve gently in an ulnar direction.  The flexor pollicis longus was inspected and no cyst or other mass was identified deep to the nerve.  It appeared that what we had seen on the MRI was indeed a very edematous median nerve, which was more radial and deeper than usual.  The wound was inspected for bleeding points which were electrocauterized bipolar current, followed by repair of the skin with intradermal 3-0 Prolene suture.  Steri-Strips was applied, followed by infiltration of the wound margin with 2% lidocaine for postoperative comfort.  The tourniquet was released with immediate capillary refill.  Miranda [name] was placed in a compressive dressing with a volar plaster splint maintaining the wrist in 15 degrees dorsiflexion.     Katy Fitch Artice Bergerson, M.D.     RVS/MEDQ  D:  03/14/2013  T:  03/14/2013  Job:  [id number]

## 2013-05-01 ENCOUNTER — Other Ambulatory Visit (HOSPITAL_COMMUNITY): Payer: Self-pay | Admitting: Gastroenterology

## 2013-05-01 DIAGNOSIS — R109 Unspecified abdominal pain: Secondary | ICD-10-CM

## 2013-05-17 ENCOUNTER — Encounter (HOSPITAL_COMMUNITY)
Admission: RE | Admit: 2013-05-17 | Discharge: 2013-05-17 | Disposition: A | Discharge status: Home / Self Care | Payer: Medicaid Other | Source: Ambulatory Visit | Attending: Gastroenterology | Admitting: Gastroenterology

## 2013-05-17 DIAGNOSIS — R109 Unspecified abdominal pain: Secondary | ICD-10-CM | POA: Insufficient documentation

## 2013-05-17 MED ORDER — TECHNETIUM TC 99M MEBROFENIN IV KIT
5.4000 | PACK | Freq: Once | INTRAVENOUS | Status: AC | PRN
  Administered 2013-05-17: 5 via INTRAVENOUS

## 2013-05-17 MED ORDER — FLUDEOXYGLUCOSE F - 18 (FDG) INJECTION: 18.5000 | Freq: Once | INTRAVENOUS | Status: AC | PRN

## 2013-05-27 DIAGNOSIS — K760 Fatty (change of) liver, not elsewhere classified: Secondary | ICD-10-CM

## 2013-05-27 HISTORY — DX: Fatty (change of) liver, not elsewhere classified: K76.0

## 2013-05-31 ENCOUNTER — Other Ambulatory Visit: Payer: Self-pay | Admitting: Gastroenterology

## 2013-05-31 DIAGNOSIS — R109 Unspecified abdominal pain: Secondary | ICD-10-CM

## 2013-06-07 ENCOUNTER — Other Ambulatory Visit: Payer: BC Managed Care – PPO

## 2013-06-09 ENCOUNTER — Ambulatory Visit
Admission: RE | Admit: 2013-06-09 | Discharge: 2013-06-09 | Disposition: A | Discharge status: Home / Self Care | Payer: Medicaid Other | Source: Ambulatory Visit | Attending: Gastroenterology | Admitting: Gastroenterology

## 2013-06-09 DIAGNOSIS — R109 Unspecified abdominal pain: Secondary | ICD-10-CM

## 2013-06-09 MED ORDER — GADOBENATE DIMEGLUMINE 529 MG/ML IV SOLN
20.0000 mL | Freq: Once | INTRAVENOUS | Status: AC | PRN
  Administered 2013-06-09: 20 mL via INTRAVENOUS

## 2013-06-10 ENCOUNTER — Other Ambulatory Visit: Payer: Self-pay | Admitting: Gastroenterology

## 2013-07-10 ENCOUNTER — Encounter (INDEPENDENT_AMBULATORY_CARE_PROVIDER_SITE_OTHER): Payer: Self-pay | Admitting: Surgery

## 2013-07-10 ENCOUNTER — Ambulatory Visit (INDEPENDENT_AMBULATORY_CARE_PROVIDER_SITE_OTHER): Payer: Medicaid Other | Admitting: Surgery

## 2013-07-10 VITALS — BP 112/84 | HR 60 | Temp 97.7°F | Resp 18 | Ht 68.0 in | Wt 212.8 lb

## 2013-07-10 DIAGNOSIS — R1011 Right upper quadrant pain: Secondary | ICD-10-CM

## 2013-07-10 DIAGNOSIS — G8929 Other chronic pain: Secondary | ICD-10-CM

## 2013-07-10 NOTE — Patient Instructions (Signed)
  [facility]  LAPAROSCOPIC SURGERY - POST-OP INSTRUCTIONS  Always review your discharge instruction sheet given to you by the facility where your surgery was performed.  A prescription for pain medication may be given to you upon discharge.  Take your pain medication as prescribed.  If narcotic pain medicine is not needed, then you may take acetaminophen (Tylenol) or ibuprofen (Advil) as needed.  Take your usually prescribed medications unless otherwise directed.  If you need a refill on your pain medication, please contact your pharmacy.  They will contact our office to request authorization. Prescriptions will not be filled after 5 P.M. or on weekends.  You should follow a light diet the first few days after arrival home, such as soup and crackers or toast.  Be sure to include plenty of fluids daily.  Most patients will experience some swelling and bruising in the area of the incisions.  Ice packs will help.  Swelling and bruising can take several days to resolve.   It is common to experience some constipation if taking pain medication after surgery.  Increasing fluid intake and taking a stool softener (such as Colace) will usually help or prevent this problem from occurring.  A mild laxative (Milk of Magnesia or Miralax) should be taken according to package instructions if there are no bowel movements after 48 hours.  Unless discharge instructions indicate otherwise, you may remove your bandages 24-48 hours after surgery, and you may shower at that time.  You may have steri-strips (small skin tapes) in place directly over the incision.  These strips should be left on the skin for 7-10 days.  If your surgeon used skin glue on the incision, you may shower in 24 hours.  The glue will flake off over the next 2-3 weeks.  Any sutures or staples will be removed at the office during your follow-up visit.  ACTIVITIES:  You may resume regular (light) daily activities beginning the  next day-such as daily self-care, walking, climbing stairs-gradually increasing activities as tolerated.  You may have sexual intercourse when it is comfortable.  Refrain from any heavy lifting or straining until approved by your doctor.  You may drive when you are no longer taking prescription pain medication, you can comfortably wear a seatbelt, and you can safely maneuver your car and apply brakes.  You should see your doctor in the office for a follow-up appointment approximately 2-3 weeks after your surgery.  Make sure that you call for this appointment within a day or two after you arrive home to insure a convenient appointment time.  WHEN TO CALL YOUR DOCTOR: 1. Fever over 101.0 2. Inability to urinate 3. Continued bleeding from incision 4. Increased pain, redness, or drainage from the incision 5. Increasing abdominal pain  The clinic staff is available to answer your questions during regular business hours.  Please don't hesitate to call and ask to speak to one of the nurses for clinical concerns.  If you have a medical emergency, go to the nearest emergency room or call 911.  A surgeon from [facility] is always on call for the hospital.  Rowan Blaker M. Brenley Priore, MD, FACS [facility] Office: [phone] Toll Free:  [phone] FAX [phone]  Web site: [email or web address] 

## 2013-07-10 NOTE — Progress Notes (Signed)
General Surgery - [facility]  Chief Complaint  Patient presents with  . Abdominal Pain    new patient evaluation - referral from Dr. Marc Magod and Dr. Steve South    HISTORY: Patient is a 47-year-old female referred by her gastroenterologist and her primary care physician for evaluation of intermittent right upper quadrant abdominal pain.  Patient has experienced symptoms for approximately one year. Episodes occur once or twice weekly. She experiences epigastric pain extending into the right upper quadrant of the abdomen and radiating to the shoulder blade posteriorly. This is associated with nausea but not emesis. Patient describes a bloating sensation. Patient has been unable to determine whether this is related to certain foods. It occurs both at night and during the daytime.  Patient has undergone full diagnostic workup including an abdominal ultrasound performed at an outside institution showing no sign of gallstones. MRI scan was obtained which shows hemangiomas and cysts in the liver but no evidence of tumor or biliary pathology. Nuclear medicine hepatobiliary scanning was performed and was negative with a normal ejection fraction of over 60%. Patient underwent an upper endoscopy which was negative for pathology. Laboratory studies have been normal.  Family history of gallbladder disease in the patient's mother and maternal grandmother.  Patient has had prior cesarean section, exploratory laparotomy, and appendectomy.  Past Medical History  Diagnosis Date  . History of idiopathic thrombocytopenic purpura     age 4 - no problems since  . Hypothyroidism   . Personal history of goiter   . 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 Family history of anesthesia complication     pt's mother has hx. of being hard to wake up post-op  . Dental crown present   . ITP (idiopathic thrombocytopenic purpura)     Current Outpatient Prescriptions  Medication Sig  Dispense Refill  . levothyroxine (SYNTHROID, LEVOTHROID) 150 MCG tablet Take 150 mcg by mouth daily before breakfast. 150 MCG ON EVEN DAYS, 300 MCG ON ODD DAYS      . Multiple Vitamin (MULTIVITAMIN WITH MINERALS) TABS tablet Take 1 tablet by mouth daily.       No current facility-administered medications for this visit.    Allergies  Allergen Reactions  . Iodinated Diagnostic Agents Anaphylaxis  . Shellfish Allergy Anaphylaxis  . Demerol [Meperidine] Other (See Comments)    HALLUCINATIONS    Family History  Problem Relation Age of Onset  . Anesthesia problems Mother     hard to wake up post-op  . Cancer Father     skin  . Heart disease Father   . Cancer Maternal Aunt     breast  . Cancer Paternal Aunt     skin  . Cancer Paternal Grandfather     colon    History   Social History  . Marital Status: Divorced    Spouse Name: N/A    Number of Children: N/A  . Years of Education: N/A   Social History Main Topics  . Smoking status: Never Smoker   . Smokeless tobacco: Never Used  . Alcohol Use: No  . Drug Use: No  . Sexual Activity: None   Other Topics Concern  . None   Social History Narrative  . None    REVIEW OF SYSTEMS - PERTINENT POSITIVES ONLY: Intermittent epigastric and right upper quadrant abdominal pain radiating to the back; intermittent nausea; intermittent diarrhea; no history of jaundice or acholic stools  EXAM: Filed Vitals:   07/10/13 0955    BP: 112/84  Pulse: 60  Temp: 97.7 F (36.5 C)  Resp: 18    GENERAL: well-developed, well-nourished, no acute distress HEENT: normocephalic; pupils equal and reactive; sclerae clear; dentition good; mucous membranes moist NECK:  No palpable masses in the thyroid bed; symmetric on extension; no palpable anterior or posterior cervical lymphadenopathy; no supraclavicular masses; no tenderness CHEST: clear to auscultation bilaterally without rales, rhonchi, or wheezes CARDIAC: regular rate and rhythm without  significant murmur; peripheral pulses are full ABDOMEN: soft without distension; bowel sounds present; no mass; no hepatosplenomegaly; no hernia; well-healed surgical incision at the umbilicus and right lower quadrant; mild tenderness to deep palpation in the right upper quadrant EXT:  non-tender without edema; no deformity NEURO: no gross focal deficits; no sign of tremor   LABORATORY RESULTS: See [facility] (CHL-Epic) for most recent results  RADIOLOGY RESULTS: See [facility] (CHL-Epic) for most recent results  IMPRESSION: Abdominal pain, right upper quadrant, intermittent  PLAN: Patient and I discussed the above findings at length. We reviewed her test results. I provided her with written literature on surgery for gallbladder disease to review at home.  I have recommended laparoscopic cholecystectomy with intraoperative cholangiography. We discussed the procedure. We discussed the potential need for conversion to open surgery. We discussed the hospital stay to be anticipated and her postoperative recovery.  We discussed the fact that she does not have documented gallstones and has a normal hepatobiliary scan. I explained to her that her chances of complete symptom relief are on the order of 50-60%.  The risks and benefits of the procedure have been discussed at length with the patient.  The patient understands the proposed procedure, potential alternative treatments, and the course of recovery to be expected.  All of the patient's questions have been answered at this time.  The patient wishes to proceed with surgery.  Benita Boonstra M. Willis Kuipers, MD, FACS General & Endocrine Surgery [facility]  Primary Care Physician: SOUTH,STEPHEN ALAN, MD   

## 2013-07-14 ENCOUNTER — Encounter (HOSPITAL_COMMUNITY): Payer: Self-pay | Admitting: Pharmacy Technician

## 2013-07-18 ENCOUNTER — Encounter (HOSPITAL_COMMUNITY): Payer: Self-pay

## 2013-07-18 ENCOUNTER — Encounter (HOSPITAL_COMMUNITY)
Admission: RE | Admit: 2013-07-18 | Discharge: 2013-07-18 | Disposition: A | Discharge status: Home / Self Care | Payer: Medicaid Other | Source: Ambulatory Visit | Attending: Surgery | Admitting: Surgery

## 2013-07-18 DIAGNOSIS — Z01812 Encounter for preprocedural laboratory examination: Secondary | ICD-10-CM | POA: Insufficient documentation

## 2013-07-18 LAB — BASIC METABOLIC PANEL
BUN: 17 mg/dL (ref 6–23)
CO2: 27 mEq/L (ref 19–32)
Calcium: 9.4 mg/dL (ref 8.4–10.5)
Chloride: 99 mEq/L (ref 96–112)
Creatinine, Ser: 0.91 mg/dL (ref 0.50–1.10)
GFR calc Af Amer: 86 mL/min — ABNORMAL LOW (ref >90)
GFR calc non Af Amer: 75 mL/min — ABNORMAL LOW (ref >90)
Glucose, Bld: 101 mg/dL — ABNORMAL HIGH (ref 70–99)
Potassium: 4 mEq/L (ref 3.5–5.1)
Sodium: 137 mEq/L (ref 135–145)

## 2013-07-18 LAB — CBC
HCT: 46.3 % — ABNORMAL HIGH (ref 36.0–46.0)
Hemoglobin: 15.3 g/dL — ABNORMAL HIGH (ref 12.0–15.0)
MCH: 29.7 pg (ref 26.0–34.0)
MCHC: 33 g/dL (ref 30.0–36.0)
MCV: 89.9 fL (ref 78.0–100.0)
Platelets: 390 10*3/uL (ref 150–400)
RBC: 5.15 MIL/uL — ABNORMAL HIGH (ref 3.87–5.11)
RDW: 14 % (ref 11.5–15.5)
WBC: 8.6 10*3/uL (ref 4.0–10.5)

## 2013-07-18 NOTE — Patient Instructions (Addendum)
Audre H Guilbault  07/18/2013                           YOUR PROCEDURE IS SCHEDULED ON: 07/28/13               PLEASE REPORT TO SHORT STAY CENTER AT : 10:30 AM               CALL THIS NUMBER IF ANY PROBLEMS THE DAY OF SURGERY :               [phone]                      REMEMBER:   Do not eat food or drink liquids AFTER MIDNIGHT  May have clear liquids UNTIL 6 HOURS BEFORE SURGERY (7:00 AM)  Clear liquids include soda, tea, black coffee, apple or grape juice, broth.  Take these medicines the morning of surgery with A SIP OF WATER: SYNTHROID   Do not wear jewelry, make-up   Do not wear lotions, powders, or perfumes.   Do not shave legs or underarms 12 hrs. before surgery (men may shave face)  Do not bring valuables to the hospital.  Contacts, dentures or bridgework may not be worn into surgery.  Leave suitcase in the car. After surgery it may be brought to your room.  For patients admitted to the hospital more than one night, checkout time is 11:00                          The day of discharge.   Patients discharged the day of surgery will not be allowed to drive home                             If going home same day of surgery, must have someone stay with you first                           24 hrs at home and arrange for some one to drive you home from hospital.    Special Instructions:   Please read over the following fact sheets that you were given:               1.  STOP ASPIRIN AND HERBAL MEDS 5 DAYS PREOP                      2. [place] PREPARING FOR SURGERY SHEET                                                X_____________________________________________________________________        Failure to follow these instructions may result in cancellation of your surgery

## 2013-07-23 NOTE — Progress Notes (Signed)
Quick Note:  These results are acceptable for scheduled surgery.  Burnie Therien M. Maelle Sheaffer, MD, FACS [facility] Office: [phone]   ______ 

## 2013-07-28 ENCOUNTER — Encounter (HOSPITAL_COMMUNITY): Payer: Self-pay | Admitting: *Deleted

## 2013-07-28 ENCOUNTER — Encounter (HOSPITAL_COMMUNITY): Payer: Medicaid Other | Admitting: Certified Registered Nurse Anesthetist

## 2013-07-28 ENCOUNTER — Observation Stay (HOSPITAL_COMMUNITY)
Admission: RE | Admit: 2013-07-28 | Discharge: 2013-07-29 | Disposition: A | Discharge status: Home / Self Care | Payer: Medicaid Other | Source: Ambulatory Visit | Attending: Surgery | Admitting: Surgery

## 2013-07-28 ENCOUNTER — Ambulatory Visit (HOSPITAL_COMMUNITY): Payer: Medicaid Other | Admitting: Certified Registered Nurse Anesthetist

## 2013-07-28 ENCOUNTER — Encounter (HOSPITAL_COMMUNITY)
Admission: RE | Disposition: A | Discharge status: Home / Self Care | Payer: Self-pay | Source: Ambulatory Visit | Attending: Surgery

## 2013-07-28 DIAGNOSIS — K7689 Other specified diseases of liver: Secondary | ICD-10-CM | POA: Insufficient documentation

## 2013-07-28 DIAGNOSIS — K811 Chronic cholecystitis: Principal | ICD-10-CM | POA: Insufficient documentation

## 2013-07-28 DIAGNOSIS — R1013 Epigastric pain: Secondary | ICD-10-CM | POA: Insufficient documentation

## 2013-07-28 DIAGNOSIS — E039 Hypothyroidism, unspecified: Secondary | ICD-10-CM | POA: Insufficient documentation

## 2013-07-28 DIAGNOSIS — Z79899 Other long term (current) drug therapy: Secondary | ICD-10-CM | POA: Insufficient documentation

## 2013-07-28 DIAGNOSIS — D693 Immune thrombocytopenic purpura: Secondary | ICD-10-CM | POA: Insufficient documentation

## 2013-07-28 DIAGNOSIS — R109 Unspecified abdominal pain: Secondary | ICD-10-CM | POA: Diagnosis present

## 2013-07-28 DIAGNOSIS — D1803 Hemangioma of intra-abdominal structures: Secondary | ICD-10-CM | POA: Insufficient documentation

## 2013-07-28 DIAGNOSIS — G8929 Other chronic pain: Secondary | ICD-10-CM | POA: Insufficient documentation

## 2013-07-28 DIAGNOSIS — Z8379 Family history of other diseases of the digestive system: Secondary | ICD-10-CM | POA: Insufficient documentation

## 2013-07-28 DIAGNOSIS — R1011 Right upper quadrant pain: Secondary | ICD-10-CM

## 2013-07-28 HISTORY — PX: CHOLECYSTECTOMY: SHX55

## 2013-07-28 SURGERY — LAPAROSCOPIC CHOLECYSTECTOMY WITH INTRAOPERATIVE CHOLANGIOGRAM
Anesthesia: General | Site: Abdomen

## 2013-07-28 MED ORDER — PROPOFOL 10 MG/ML IV BOLUS
INTRAVENOUS | Status: DC | PRN
  Administered 2013-07-28: 200 mg via INTRAVENOUS

## 2013-07-28 MED ORDER — FENTANYL CITRATE 0.05 MG/ML IJ SOLN
INTRAMUSCULAR | Status: AC
  Filled 2013-07-28: qty 2

## 2013-07-28 MED ORDER — HYDROMORPHONE HCL PF 1 MG/ML IJ SOLN
0.2500 mg | INTRAMUSCULAR | Status: DC | PRN
  Administered 2013-07-28 (×2): 0.5 mg via INTRAVENOUS

## 2013-07-28 MED ORDER — HYDROMORPHONE HCL PF 1 MG/ML IJ SOLN
1.0000 mg | INTRAMUSCULAR | Status: DC | PRN
  Administered 2013-07-28: 1 mg via INTRAVENOUS
  Filled 2013-07-28: qty 1

## 2013-07-28 MED ORDER — DEXAMETHASONE SODIUM PHOSPHATE 10 MG/ML IJ SOLN
INTRAMUSCULAR | Status: DC | PRN
  Administered 2013-07-28: 10 mg via INTRAVENOUS

## 2013-07-28 MED ORDER — ACETAMINOPHEN 325 MG PO TABS: 650.0000 mg | ORAL_TABLET | ORAL | Status: DC | PRN

## 2013-07-28 MED ORDER — GLYCOPYRROLATE 0.2 MG/ML IJ SOLN
INTRAMUSCULAR | Status: AC
Start: 2013-07-28 — End: 2013-07-28
  Filled 2013-07-28: qty 4

## 2013-07-28 MED ORDER — LACTATED RINGERS IV SOLN
INTRAVENOUS | Status: DC
  Administered 2013-07-28: 1000 mL via INTRAVENOUS

## 2013-07-28 MED ORDER — LIDOCAINE HCL (CARDIAC) 20 MG/ML IV SOLN
INTRAVENOUS | Status: DC | PRN
  Administered 2013-07-28: 100 mg via INTRAVENOUS

## 2013-07-28 MED ORDER — BUPIVACAINE-EPINEPHRINE 0.5% -1:200000 IJ SOLN
INTRAMUSCULAR | Status: DC | PRN
  Administered 2013-07-28: 30 mL

## 2013-07-28 MED ORDER — KETOROLAC TROMETHAMINE 30 MG/ML IJ SOLN
15.0000 mg | Freq: Once | INTRAMUSCULAR | Status: AC | PRN
  Administered 2013-07-28: 30 mg via INTRAVENOUS

## 2013-07-28 MED ORDER — MIDAZOLAM HCL 5 MG/5ML IJ SOLN
INTRAMUSCULAR | Status: DC | PRN
  Administered 2013-07-28: 2 mg via INTRAVENOUS

## 2013-07-28 MED ORDER — ONDANSETRON HCL 4 MG/2ML IJ SOLN
INTRAMUSCULAR | Status: AC
  Filled 2013-07-28: qty 2

## 2013-07-28 MED ORDER — KCL IN DEXTROSE-NACL 20-5-0.45 MEQ/L-%-% IV SOLN
INTRAVENOUS | Status: AC
  Filled 2013-07-28: qty 1000

## 2013-07-28 MED ORDER — GLYCOPYRROLATE 0.2 MG/ML IJ SOLN
INTRAMUSCULAR | Status: DC | PRN
  Administered 2013-07-28: .8 mg via INTRAVENOUS

## 2013-07-28 MED ORDER — NEOSTIGMINE METHYLSULFATE 1 MG/ML IJ SOLN
INTRAMUSCULAR | Status: DC | PRN
  Administered 2013-07-28: 5 mg via INTRAVENOUS

## 2013-07-28 MED ORDER — ONDANSETRON HCL 4 MG PO TABS: 4.0000 mg | ORAL_TABLET | Freq: Four times a day (QID) | ORAL | Status: DC | PRN

## 2013-07-28 MED ORDER — LEVOTHYROXINE SODIUM 150 MCG PO TABS
150.0000 ug | ORAL_TABLET | ORAL | Status: DC
  Administered 2013-07-29: 150 ug via ORAL
  Filled 2013-07-28 (×2): qty 1

## 2013-07-28 MED ORDER — KETOROLAC TROMETHAMINE 30 MG/ML IJ SOLN
INTRAMUSCULAR | Status: AC
  Filled 2013-07-28: qty 1

## 2013-07-28 MED ORDER — DEXAMETHASONE SODIUM PHOSPHATE 10 MG/ML IJ SOLN
INTRAMUSCULAR | Status: AC
  Filled 2013-07-28: qty 1

## 2013-07-28 MED ORDER — CEFAZOLIN SODIUM-DEXTROSE 2-3 GM-% IV SOLR
INTRAVENOUS | Status: AC
  Filled 2013-07-28: qty 50

## 2013-07-28 MED ORDER — MIDAZOLAM HCL 2 MG/2ML IJ SOLN
INTRAMUSCULAR | Status: AC
  Filled 2013-07-28: qty 2

## 2013-07-28 MED ORDER — ONDANSETRON HCL 4 MG/2ML IJ SOLN: 4.0000 mg | Freq: Four times a day (QID) | INTRAMUSCULAR | Status: DC | PRN

## 2013-07-28 MED ORDER — ROCURONIUM BROMIDE 100 MG/10ML IV SOLN
INTRAVENOUS | Status: DC | PRN
  Administered 2013-07-28: 50 mg via INTRAVENOUS

## 2013-07-28 MED ORDER — ROCURONIUM BROMIDE 100 MG/10ML IV SOLN
INTRAVENOUS | Status: AC
  Filled 2013-07-28: qty 1

## 2013-07-28 MED ORDER — KCL IN DEXTROSE-NACL 20-5-0.45 MEQ/L-%-% IV SOLN
INTRAVENOUS | Status: DC
  Administered 2013-07-28: 15:00:00 via INTRAVENOUS
  Filled 2013-07-28 (×2): qty 1000

## 2013-07-28 MED ORDER — CEFAZOLIN SODIUM-DEXTROSE 2-3 GM-% IV SOLR
2.0000 g | INTRAVENOUS | Status: AC
  Administered 2013-07-28: 2 g via INTRAVENOUS

## 2013-07-28 MED ORDER — ONDANSETRON HCL 4 MG/2ML IJ SOLN
INTRAMUSCULAR | Status: DC | PRN
  Administered 2013-07-28: 4 mg via INTRAVENOUS

## 2013-07-28 MED ORDER — PROPOFOL 10 MG/ML IV BOLUS
INTRAVENOUS | Status: AC
  Filled 2013-07-28: qty 20

## 2013-07-28 MED ORDER — HYDROMORPHONE HCL PF 1 MG/ML IJ SOLN
INTRAMUSCULAR | Status: AC
  Filled 2013-07-28: qty 1

## 2013-07-28 MED ORDER — LACTATED RINGERS IR SOLN
Status: DC | PRN
  Administered 2013-07-28: 1000 mL

## 2013-07-28 MED ORDER — LEVOTHYROXINE SODIUM 150 MCG PO TABS
300.0000 ug | ORAL_TABLET | ORAL | Status: DC
  Filled 2013-07-28: qty 2

## 2013-07-28 MED ORDER — 0.9 % SODIUM CHLORIDE (POUR BTL) OPTIME
TOPICAL | Status: DC | PRN
  Administered 2013-07-28: 1000 mL

## 2013-07-28 MED ORDER — FENTANYL CITRATE 0.05 MG/ML IJ SOLN
INTRAMUSCULAR | Status: AC
  Filled 2013-07-28: qty 5

## 2013-07-28 MED ORDER — FENTANYL CITRATE 0.05 MG/ML IJ SOLN
INTRAMUSCULAR | Status: DC | PRN
  Administered 2013-07-28: 50 ug via INTRAVENOUS
  Administered 2013-07-28 (×2): 100 ug via INTRAVENOUS
  Administered 2013-07-28: 50 ug via INTRAVENOUS

## 2013-07-28 MED ORDER — LIDOCAINE HCL (CARDIAC) 20 MG/ML IV SOLN
INTRAVENOUS | Status: AC
  Filled 2013-07-28: qty 5

## 2013-07-28 MED ORDER — BUPIVACAINE-EPINEPHRINE PF 0.5-1:200000 % IJ SOLN
INTRAMUSCULAR | Status: AC
  Filled 2013-07-28: qty 30

## 2013-07-28 MED ORDER — PROMETHAZINE HCL 25 MG/ML IJ SOLN: 6.2500 mg | INTRAMUSCULAR | Status: DC | PRN

## 2013-07-28 MED ORDER — NEOSTIGMINE METHYLSULFATE 1 MG/ML IJ SOLN
INTRAMUSCULAR | Status: AC
  Filled 2013-07-28: qty 10

## 2013-07-28 MED ORDER — HYDROCODONE-ACETAMINOPHEN 5-325 MG PO TABS
1.0000 | ORAL_TABLET | ORAL | Status: DC | PRN
  Administered 2013-07-29 (×2): 2 via ORAL
  Filled 2013-07-28 (×2): qty 2

## 2013-07-28 SURGICAL SUPPLY — 38 items
APL SKNCLS STERI-STRIP NONHPOA (GAUZE/BANDAGES/DRESSINGS) ×1
APPLIER CLIP ROT 10 11.4 M/L (STAPLE) ×3
APR CLP MED LRG 11.4X10 (STAPLE) ×1
BENZOIN TINCTURE PRP APPL 2/3 (GAUZE/BANDAGES/DRESSINGS) ×3 IMPLANT
CABLE HIGH FREQUENCY MONO STRZ (ELECTRODE) ×3 IMPLANT
CANISTER SUCTION 2500CC (MISCELLANEOUS) ×3 IMPLANT
CHLORAPREP W/TINT 26ML (MISCELLANEOUS) ×3 IMPLANT
CLIP APPLIE ROT 10 11.4 M/L (STAPLE) ×1 IMPLANT
CLOSURE WOUND 1/2 X4 (GAUZE/BANDAGES/DRESSINGS) ×1
COVER MAYO STAND STRL (DRAPES) ×3 IMPLANT
DECANTER SPIKE VIAL GLASS SM (MISCELLANEOUS) ×3 IMPLANT
DRAPE C-ARM 42X120 X-RAY (DRAPES) ×3 IMPLANT
DRAPE LAPAROSCOPIC ABDOMINAL (DRAPES) ×3 IMPLANT
DRAPE UTILITY XL STRL (DRAPES) ×3 IMPLANT
ELECT REM PT RETURN 9FT ADLT (ELECTROSURGICAL) ×3
ELECTRODE REM PT RTRN 9FT ADLT (ELECTROSURGICAL) ×1 IMPLANT
GAUZE SPONGE 2X2 8PLY STRL LF (GAUZE/BANDAGES/DRESSINGS) ×1 IMPLANT
GLOVE SURG ORTHO 8.0 STRL STRW (GLOVE) ×3 IMPLANT
GOWN STRL REIN XL XLG (GOWN DISPOSABLE) ×6 IMPLANT
HEMOSTAT SURGICEL 4X8 (HEMOSTASIS) IMPLANT
KIT BASIN OR (CUSTOM PROCEDURE TRAY) ×3 IMPLANT
NS IRRIG 1000ML POUR BTL (IV SOLUTION) ×3 IMPLANT
POUCH SPECIMEN RETRIEVAL 10MM (ENDOMECHANICALS) ×3 IMPLANT
SCISSORS LAP 5X35 DISP (ENDOMECHANICALS) ×3 IMPLANT
SET CHOLANGIOGRAPH MIX (MISCELLANEOUS) ×3 IMPLANT
SET IRRIG TUBING LAPAROSCOPIC (IRRIGATION / IRRIGATOR) ×3 IMPLANT
SLEEVE XCEL OPT CAN 5 100 (ENDOMECHANICALS) ×3 IMPLANT
SOLUTION ANTI FOG 6CC (MISCELLANEOUS) ×3 IMPLANT
SPONGE GAUZE 2X2 STER 10/PKG (GAUZE/BANDAGES/DRESSINGS) ×2
STRIP CLOSURE SKIN 1/2X4 (GAUZE/BANDAGES/DRESSINGS) ×2 IMPLANT
SUT MNCRL AB 4-0 PS2 18 (SUTURE) ×3 IMPLANT
TOWEL OR 17X26 10 PK STRL BLUE (TOWEL DISPOSABLE) ×3 IMPLANT
TOWEL OR NON WOVEN STRL DISP B (DISPOSABLE) ×3 IMPLANT
TRAY LAP CHOLE (CUSTOM PROCEDURE TRAY) ×3 IMPLANT
TROCAR BLADELESS OPT 5 100 (ENDOMECHANICALS) ×3 IMPLANT
TROCAR XCEL BLUNT TIP 100MML (ENDOMECHANICALS) ×3 IMPLANT
TROCAR XCEL NON-BLD 11X100MML (ENDOMECHANICALS) ×3 IMPLANT
TUBING INSUFFLATION 10FT LAP (TUBING) ×3 IMPLANT

## 2013-07-28 NOTE — Preoperative (Signed)
Beta Blockers   Reason not to administer Beta Blockers:Not Applicable 

## 2013-07-28 NOTE — Transfer of Care (Signed)
Immediate Anesthesia Transfer of Care Note  Patient: Miranda [name]  Procedure(s) Performed: Procedure(s): LAPAROSCOPIC CHOLECYSTECTOMY  (N/A)  Patient Location: PACU  Anesthesia Type:General  Level of Consciousness: awake, alert , oriented and patient cooperative  Airway & Oxygen Therapy: Patient Spontanous Breathing and Patient connected to face mask oxygen  Post-op Assessment: Report given to PACU RN, Post -op Vital signs reviewed and stable and Patient moving all extremities  Post vital signs: Reviewed and stable  Complications: No apparent anesthesia complications

## 2013-07-28 NOTE — Anesthesia Preprocedure Evaluation (Addendum)
Anesthesia Evaluation  Patient identified by MRN, date of birth, ID band Patient awake    Reviewed: Allergy & Precautions, H&P , NPO status , Patient's Chart, lab work & pertinent test results  Airway Mallampati: II TM Distance: >3 FB Neck ROM: Full    Dental no notable dental hx.    Pulmonary neg pulmonary ROS,  breath sounds clear to auscultation  Pulmonary exam normal       Cardiovascular negative cardio ROS  Rhythm:Regular Rate:Normal     Neuro/Psych negative neurological ROS  negative psych ROS   GI/Hepatic negative GI ROS, Neg liver ROS,   Endo/Other  Hypothyroidism   Renal/GU negative Renal ROS  negative genitourinary   Musculoskeletal negative musculoskeletal ROS (+)   Abdominal   Peds negative pediatric ROS (+)  Hematology negative hematology ROS (+)   Anesthesia Other Findings   Reproductive/Obstetrics negative OB ROS                           Anesthesia Physical Anesthesia Plan  ASA: II  Anesthesia Plan: General   Post-op Pain Management:    Induction: Intravenous  Airway Management Planned: Oral ETT  Additional Equipment:   Intra-op Plan:   Post-operative Plan: Extubation in OR  Informed Consent: I have reviewed the patients History and Physical, chart, labs and discussed the procedure including the risks, benefits and alternatives for the proposed anesthesia with the patient or authorized representative who has indicated his/her understanding and acceptance.   Dental advisory given  Plan Discussed with: CRNA and Surgeon  Anesthesia Plan Comments:         Anesthesia Quick Evaluation  

## 2013-07-28 NOTE — Op Note (Signed)
Procedure Note  Pre-operative Diagnosis:  Abdominal pain  Post-operative Diagnosis:  same  Surgeon:  [name], MD, FACS  Assistant:  none   Procedure:  Laparoscopic cholecystectomy  Anesthesia:  General  Estimated Blood Loss:  minimal  Drains: none         Specimen: Gallbladder to pathology  Indications:  Patient presents with history of RUQ abdominal pain and nausea.  Work-up is unrevealing.  Family history of symptomatic gallbladder disease.  Patient now presents for cholecystectomy.  Cholangiogram not performed due to history of anaphylaxis with exposure to iodine containing contrast media.  Procedure Details:  The patient was seen in the pre-op holding area. The risks, benefits, complications, treatment options, and expected outcomes have been discussed with the patient. The patient agreed with the proposed plan and signed the informed consent form.  The patient was taken to Operating Room, identified as Miranda [name] and the procedure verified as Laparoscopic Cholecystectomy. A "time out" was completed and the above information confirmed.  Following induction of general anesthesia, the patient was placed in the supine position. The abdomen was prepped and draped in the usual aseptic fashion.  An incision was made in the skin below the umbilicus. The midline fascia was incised and the peritoneal cavity entered and the Hasson canula was introduced under direct vision.  The Hasson canula was secured with a 0-Vicryl pursestring suture. Pneumoperitoneum was established with carbon dioxide. Additional trocars were introduced under direct vision along the right costal margin in the midline, mid-clavicular line, and anterior axillary line.   The gallbladder was identified and the fundus grasped and retracted cephalad. Adhesions were taken down bluntly and the electrocautery was utilized as needed, taking care not to injure any adjacent structures. The infundibulum was grasped and  retracted laterally, exposing the peritoneum overlying the triangle of Calot. The peritoneum was incised and structures exposed with blunt dissection. The cystic duct was clearly identified, bluntly dissected circumferentially, and clipped at the neck of the gallbladder.  The cystic duct was then doubly ligated with surgical clips and divided. The cystic artery was identified, dissected circumferentially, doubly ligated with ligaclips, and divided.  The gallbladder was dissected away from the liver bed using the electrocautery for hemostasis. The gallbladder was completely removed from the liver and placed into an endocatch bag. The right upper quadrant was irrigated and the gallbladder bed was inspected. Hemostasis was achieved with the electrocautery. Warm saline irrigation was utilized until clear.  Pneumoperitoneum was released after viewing removal of the trocars with good hemostasis noted. The umbilical wound was irrigated and the fascia was then closed with the pursestring suture.  Local anesthetic was infiltrated at all port sites. The skin incisions were closed with 4-0 Monocril subcuticular sutures and steri-strips and dressings were applied.  Instrument, sponge, and needle counts were correct at the conclusion of the case.  The patient was awakened from anesthesia and brought to the recovery room in stable condition.  The patient tolerated the procedure well.   [name], MD, [facility] Office: [phone]

## 2013-07-28 NOTE — H&P (View-Only) (Signed)
General Surgery [facility]  Chief Complaint  Patient presents with  . Abdominal Pain    new patient evaluation - referral from Dr. Clarene Essex and Dr. Carrolyn Meiers    HISTORY: Patient is a 48 year old female referred by her gastroenterologist and her primary care physician for evaluation of intermittent right upper quadrant abdominal pain.  Patient has experienced symptoms for approximately one year. Episodes occur once or twice weekly. She experiences epigastric pain extending into the right upper quadrant of the abdomen and radiating to the shoulder blade posteriorly. This is associated with nausea but not emesis. Patient describes a bloating sensation. Patient has been unable to determine whether this is related to certain foods. It occurs both at night and during the daytime.  Patient has undergone full diagnostic workup including an abdominal ultrasound performed at an outside institution showing no sign of gallstones. MRI scan was obtained which shows hemangiomas and cysts in the liver but no evidence of tumor or biliary pathology. Nuclear medicine hepatobiliary scanning was performed and was negative with a normal ejection fraction of over 60%. Patient underwent an upper endoscopy which was negative for pathology. Laboratory studies have been normal.  Family history of gallbladder disease in the patient's mother and maternal grandmother.  Patient has had prior cesarean section, exploratory laparotomy, and appendectomy.  Past Medical History  Diagnosis Date  . History of idiopathic thrombocytopenic purpura     age 50 - no problems since  . Hypothyroidism   . Personal history of goiter   . 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 Family history of anesthesia complication     pt's mother has hx. of being hard to wake up post-op  . Dental crown present   . ITP (idiopathic thrombocytopenic purpura)     Current Outpatient Prescriptions  Medication Sig  Dispense Refill  . levothyroxine (SYNTHROID, LEVOTHROID) 150 MCG tablet Take 150 mcg by mouth daily before breakfast. 150 MCG ON EVEN DAYS, 300 MCG ON ODD DAYS      . Multiple Vitamin (MULTIVITAMIN WITH MINERALS) TABS tablet Take 1 tablet by mouth daily.       No current facility-administered medications for this visit.    Allergies  Allergen Reactions  . Iodinated Diagnostic Agents Anaphylaxis  . Shellfish Allergy Anaphylaxis  . Demerol [Meperidine] Other (See Comments)    HALLUCINATIONS    Family History  Problem Relation Age of Onset  . Anesthesia problems Mother     hard to wake up post-op  . Cancer Father     skin  . Heart disease Father   . Cancer Maternal Aunt     breast  . Cancer Paternal Aunt     skin  . Cancer Paternal Grandfather     colon    History   Social History  . Marital Status: Divorced    Spouse Name: N/A    Number of Children: N/A  . Years of Education: N/A   Social History Main Topics  . Smoking status: Never Smoker   . Smokeless tobacco: Never Used  . Alcohol Use: No  . Drug Use: No  . Sexual Activity: None   Other Topics Concern  . None   Social History Narrative  . None    REVIEW OF SYSTEMS - PERTINENT POSITIVES ONLY: Intermittent epigastric and right upper quadrant abdominal pain radiating to the back; intermittent nausea; intermittent diarrhea; no history of jaundice or acholic stools  EXAM: Filed Vitals:   07/10/13 7846  BP: 112/84  Pulse: 60  Temp: 97.7 F (36.5 C)  Resp: 18    GENERAL: well-developed, well-nourished, no acute distress HEENT: normocephalic; pupils equal and reactive; sclerae clear; dentition good; mucous membranes moist NECK:  No palpable masses in the thyroid bed; symmetric on extension; no palpable anterior or posterior cervical lymphadenopathy; no supraclavicular masses; no tenderness CHEST: clear to auscultation bilaterally without rales, rhonchi, or wheezes CARDIAC: regular rate and rhythm without  significant murmur; peripheral pulses are full ABDOMEN: soft without distension; bowel sounds present; no mass; no hepatosplenomegaly; no hernia; well-healed surgical incision at the umbilicus and right lower quadrant; mild tenderness to deep palpation in the right upper quadrant EXT:  non-tender without edema; no deformity NEURO: no gross focal deficits; no sign of tremor   LABORATORY RESULTS: See [facility] (CHL-Epic) for most recent results  RADIOLOGY RESULTS: See [facility] (CHL-Epic) for most recent results  IMPRESSION: Abdominal pain, right upper quadrant, intermittent  PLAN: Patient and I discussed the above findings at length. We reviewed her test results. I provided her with written literature on surgery for gallbladder disease to review at home.  I have recommended laparoscopic cholecystectomy with intraoperative cholangiography. We discussed the procedure. We discussed the potential need for conversion to open surgery. We discussed the hospital stay to be anticipated and her postoperative recovery.  We discussed the fact that she does not have documented gallstones and has a normal hepatobiliary scan. I explained to her that her chances of complete symptom relief are on the order of 50-60%.  The risks and benefits of the procedure have been discussed at length with the patient.  The patient understands the proposed procedure, potential alternative treatments, and the course of recovery to be expected.  All of the patient's questions have been answered at this time.  The patient wishes to proceed with surgery.  [name], MD, [facility]  Primary Care Physician: Sheela Stack, MD

## 2013-07-28 NOTE — Discharge Instructions (Signed)
  [facility]  LAPAROSCOPIC SURGERY - POST-OP INSTRUCTIONS  Always review your discharge instruction sheet given to you by the facility where your surgery was performed.  A prescription for pain medication may be given to you upon discharge.  Take your pain medication as prescribed.  If narcotic pain medicine is not needed, then you may take acetaminophen (Tylenol) or ibuprofen (Advil) as needed.  Take your usually prescribed medications unless otherwise directed.  If you need a refill on your pain medication, please contact your pharmacy.  They will contact our office to request authorization. Prescriptions will not be filled after 5 P.M. or on weekends.  You should follow a light diet the first few days after arrival home, such as soup and crackers or toast.  Be sure to include plenty of fluids daily.  Most patients will experience some swelling and bruising in the area of the incisions.  Ice packs will help.  Swelling and bruising can take several days to resolve.   It is common to experience some constipation if taking pain medication after surgery.  Increasing fluid intake and taking a stool softener (such as Colace) will usually help or prevent this problem from occurring.  A mild laxative (Milk of Magnesia or Miralax) should be taken according to package instructions if there are no bowel movements after 48 hours.  Unless discharge instructions indicate otherwise, you may remove your bandages 24-48 hours after surgery, and you may shower at that time.  You may have steri-strips (small skin tapes) in place directly over the incision.  These strips should be left on the skin for 7-10 days.  If your surgeon used skin glue on the incision, you may shower in 24 hours.  The glue will flake off over the next 2-3 weeks.  Any sutures or staples will be removed at the office during your follow-up visit.  ACTIVITIES:  You may resume regular (light) daily activities beginning the  next day-such as daily self-care, walking, climbing stairs-gradually increasing activities as tolerated.  You may have sexual intercourse when it is comfortable.  Refrain from any heavy lifting or straining until approved by your doctor.  You may drive when you are no longer taking prescription pain medication, you can comfortably wear a seatbelt, and you can safely maneuver your car and apply brakes.  You should see your doctor in the office for a follow-up appointment approximately 2-3 weeks after your surgery.  Make sure that you call for this appointment within a day or two after you arrive home to insure a convenient appointment time.  WHEN TO CALL YOUR DOCTOR: 1. Fever over 101.0 2. Inability to urinate 3. Continued bleeding from incision 4. Increased pain, redness, or drainage from the incision 5. Increasing abdominal pain  The clinic staff is available to answer your questions during regular business hours.  Please don't hesitate to call and ask to speak to one of the nurses for clinical concerns.  If you have a medical emergency, go to the nearest emergency room or call 911.  A surgeon from [facility] is always on call for the hospital.  Demisha Nokes M. Remus Hagedorn, MD, FACS [facility] Office: [phone] Toll Free:  [phone] FAX [phone]  Web site: [email or web address] 

## 2013-07-28 NOTE — Interval H&P Note (Signed)
History and Physical Interval Note:  07/28/2013 1:43 PM  Miranda [name]  has presented today for surgery, with the diagnosis of abdominal pain.  The various methods of treatment have been discussed with the patient and family. After consideration of risks, benefits and other options for treatment, the patient has consented to    Procedure(s): LAPAROSCOPIC CHOLECYSTECTOMY WITH INTRAOPERATIVE CHOLANGIOGRAM (N/A) as a surgical intervention .    The patient's history has been reviewed, patient examined, no change in status, stable for surgery.  I have reviewed the patient's chart and labs.  Questions were answered to the patient's satisfaction.    [name], MD, [facility] Office: [place]

## 2013-07-28 NOTE — Anesthesia Postprocedure Evaluation (Signed)
  Anesthesia Post-op Note  Patient: Miranda [name]  Procedure(s) Performed: Procedure(s) (LRB): LAPAROSCOPIC CHOLECYSTECTOMY  (N/A)  Patient Location: PACU  Anesthesia Type: General  Level of Consciousness: awake and alert   Airway and Oxygen Therapy: Patient Spontanous Breathing  Post-op Pain: mild  Post-op Assessment: Post-op Vital signs reviewed, Patient's Cardiovascular Status Stable, Respiratory Function Stable, Patent Airway and No signs of Nausea or vomiting  Last Vitals:  Filed Vitals:   07/28/13 1600  BP: 123/65  Pulse: 64  Temp:   Resp: 12    Post-op Vital Signs: stable   Complications: No apparent anesthesia complications

## 2013-07-29 MED ORDER — HYDROCODONE-ACETAMINOPHEN 5-325 MG PO TABS: 1.0000 | ORAL_TABLET | ORAL | Status: DC | PRN

## 2013-07-29 NOTE — Discharge Summary (Signed)
Physician Discharge Summary  Patient ID: LUCIANNA OSTLUND MRN: [id number] DOB/AGE: 1966/05/02 48 y.o.  Admit date: 07/28/2013 Discharge date: 07/29/2013  Admission Diagnoses:  Discharge Diagnoses:  Principal Problem:   Abdominal pain, chronic, right upper quadrant Active Problems:   Abdominal pain   Discharged Condition: good  Hospital Course: uneventful post op recovery  Consults: None  Significant Diagnostic Studies: labs:   Treatments: surgery: lap chole  Discharge Exam: Blood pressure 102/64, pulse 73, temperature 97.8 F (36.6 C), temperature source Oral, resp. rate 18, height 5\' 8"  (1.727 m), weight 214 lb (97.07 kg), SpO2 95.00%. General appearance: alert, cooperative and no distress Incision/Wound: abdomen soft dressing dry  Disposition: 01-Home or Self Care     Medication List         HYDROcodone-acetaminophen 5-325 MG per tablet  Commonly known as:  NORCO/VICODIN  Take 1-2 tablets by mouth every 4 (four) hours as needed for moderate pain.     levothyroxine 150 MCG tablet  Commonly known as:  SYNTHROID, LEVOTHROID  Take 150 mcg by mouth daily before breakfast. 150 MCG ON EVEN DAYS, 300 MCG ON ODD DAYS     multivitamin with minerals Tabs tablet  Take 1 tablet by mouth daily.           Follow-up Information   Follow up with [name], MD. Schedule an appointment as soon as possible for a visit in 3 weeks.   Specialty:  General Surgery   Contact information:   [address] [place] [address] [phone]       Signed: Harl Bowie 07/29/2013, 7:53 AM

## 2013-07-29 NOTE — Progress Notes (Signed)
1 Day Post-Op  Subjective: No complaints  Objective: Vital signs in last 24 hours: Temp:  [97.3 F (36.3 C)-98.7 F (37.1 C)] 97.8 F (36.6 C) (01/03 0600) Pulse Rate:  [59-80] 73 (01/03 0600) Resp:  [11-18] 18 (01/03 0600) BP: (102-154)/(63-84) 102/64 mmHg (01/03 0600) SpO2:  [94 %-100 %] 95 % (01/03 0600) Weight:  [214 lb (97.07 kg)] 214 lb (97.07 kg) (01/02 1622) Last BM Date: 07/27/13  Intake/Output from previous day: 01/02 0701 - 01/03 0700 In: 1750 [I.V.:1750] Out: 1760 [Urine:1750; Blood:10] Intake/Output this shift:    Looks comfortable Abdomen soft, dressing dry  Lab Results:  No results found for this basename: WBC, HGB, HCT, PLT,  in the last 72 hours BMET No results found for this basename: NA, K, CL, CO2, GLUCOSE, BUN, CREATININE, CALCIUM,  in the last 72 hours PT/INR No results found for this basename: LABPROT, INR,  in the last 72 hours ABG No results found for this basename: PHART, PCO2, PO2, HCO3,  in the last 72 hours  Studies/Results: No results found.  Anti-infectives: Anti-infectives   Start     Dose/Rate Route Frequency Ordered Stop   07/28/13 0853  ceFAZolin (ANCEF) IVPB 2 g/50 mL premix     2 g 100 mL/hr over 30 Minutes Intravenous On call to O.R. 07/28/13 0853 07/28/13 1419      Assessment/Plan: s/p Procedure(s): LAPAROSCOPIC CHOLECYSTECTOMY  (N/A)  Discharge home  LOS: 1 day    Daris Aristizabal A 07/29/2013

## 2013-07-29 NOTE — Progress Notes (Signed)
Utilization Review completed.  

## 2013-07-31 ENCOUNTER — Encounter (HOSPITAL_COMMUNITY): Payer: Self-pay | Admitting: Surgery

## 2013-08-21 ENCOUNTER — Ambulatory Visit (INDEPENDENT_AMBULATORY_CARE_PROVIDER_SITE_OTHER): Payer: Medicaid Other | Admitting: Surgery

## 2013-08-21 ENCOUNTER — Encounter (INDEPENDENT_AMBULATORY_CARE_PROVIDER_SITE_OTHER): Payer: Self-pay | Admitting: Surgery

## 2013-08-21 VITALS — BP 124/76 | HR 78 | Temp 98.0°F | Resp 18 | Ht 67.5 in | Wt 216.0 lb

## 2013-08-21 DIAGNOSIS — R1011 Right upper quadrant pain: Secondary | ICD-10-CM

## 2013-08-21 DIAGNOSIS — G8929 Other chronic pain: Secondary | ICD-10-CM

## 2013-08-21 NOTE — Progress Notes (Signed)
General Surgery [facility]  Chief Complaint  Patient presents with  . Routine Post Op    lap cholecystectomy 07/28/2013    HISTORY: Patient is a 48 year old female who underwent laparoscopic cholecystectomy on 07/28/2013. Final pathology shows chronic cholecystitis. No gallstones were identified. Postoperatively she has done well. She has had no further episodes of abdominal pain.  EXAM: Surgical wounds have healed nicely. No sign of infection. No sign of herniation. Right upper quadrant is soft and nontender without palpable mass.  IMPRESSION: Status post laparoscopic cholecystectomy for chronic cholecystitis  PLAN: Patient will apply topical creams to her incisions. She is released to full activity without restriction.  Patient will return for surgical care as needed.  [name], MD, [facility]   Visit Diagnoses: 1. Abdominal pain, chronic, right upper quadrant

## 2013-08-21 NOTE — Patient Instructions (Signed)
  COCOA BUTTER & VITAMIN E CREAM  ([employer] or other brand)  Apply cocoa butter/vitamin E cream to your incision 2 - 3 times daily.  Massage cream into incision for one minute with each application.  Use sunscreen (50 SPF or higher) for first 6 months after surgery if area is exposed to sun.  You may substitute Mederma or other scar reducing creams as desired.   

## 2013-11-23 ENCOUNTER — Other Ambulatory Visit: Payer: Self-pay | Admitting: Obstetrics and Gynecology

## 2013-11-23 DIAGNOSIS — R928 Other abnormal and inconclusive findings on diagnostic imaging of breast: Secondary | ICD-10-CM

## 2013-11-24 ENCOUNTER — Encounter: Payer: Self-pay | Admitting: Neurology

## 2013-11-28 ENCOUNTER — Encounter: Payer: Self-pay | Admitting: Neurology

## 2013-11-28 ENCOUNTER — Ambulatory Visit (INDEPENDENT_AMBULATORY_CARE_PROVIDER_SITE_OTHER): Payer: Medicaid Other | Admitting: Neurology

## 2013-11-28 VITALS — BP 117/77 | HR 71 | Resp 16 | Ht 68.5 in | Wt 214.0 lb

## 2013-11-28 DIAGNOSIS — G4733 Obstructive sleep apnea (adult) (pediatric): Secondary | ICD-10-CM

## 2013-11-28 DIAGNOSIS — R0989 Other specified symptoms and signs involving the circulatory and respiratory systems: Secondary | ICD-10-CM

## 2013-11-28 DIAGNOSIS — R0683 Snoring: Secondary | ICD-10-CM

## 2013-11-28 DIAGNOSIS — R0609 Other forms of dyspnea: Secondary | ICD-10-CM

## 2013-11-28 HISTORY — DX: Obstructive sleep apnea (adult) (pediatric): G47.33

## 2013-11-28 MED ORDER — DIPHENHYDRAMINE HCL (SLEEP) 50 MG PO CAPS: 50.0000 mg | ORAL_CAPSULE | Freq: Every evening | ORAL | Status: DC

## 2013-11-28 NOTE — Progress Notes (Signed)
[facility]  Provider:  Larey Seat, M D  Referring Provider: Sheela Stack, MD Primary Care Physician:  Sheela Stack, MD  Fatigue , sleepiness   HPI:  Miranda [name] is a 48 y.o. caucasian, right handed ,divorced  female  and seen here as a referral from [name] for a sleep consultation.  Miranda [name]. Has been complaining of fatigue, bruxism and excessive daytime sleepiness. Her children have told her that she snores. They have not reported apnea. She gained weight . The last 2-3 years have been very difficult form a fatigue standpoint. Her PCP and endocrinologist, [name], treats her thyroid condition and she had a thyroidectomy in 2001 in [facility].  She is not yet in menopause by [facility] and [facility] levels.  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  Her bed time is around 10-11 Pm, sometimes she reads and often watches TV . She will fall asleep promptly some nights and other nights lay away for hours. She has at least one bathroom break at night. Usual rises at 8.30 AM , spontaneously. On vacation when not having appointments , she will sleep until 10 AM.  She described her bedroom as quiet, dark and cool.  She avoids caffeine, she doesn't smoke or drink alcohol. She may get 7 or 4 hours of sleep. 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  Her oldest son is 51 and lives alone. She is not gainfully employed at this time, lives off [address] [place]. She has a bachelor degree in accounting.  She exposes herself to daylight. She has no exercises regimen since undergoing hysterectomy in 2011. She feels she is always on the go.   She has never had a sleep test before.  Her father has Insomnia and OSA.  Her children sleep well.        Review of Systems: Out of a complete 14 system review, the patient complains of only the following symptoms, and all other reviewed systems are negative. Fatigue, weight gain of 30 pounds in the  last 3 years.   She severity score at 38 points, and the Epworth sleepiness score of 11 points, both are only slightly elevated.   Recent laboratories were in normal range.  The patient's medication was reviewed.   History   Social History  . Marital Status: Divorced    Spouse Name: N/A    Number of Children: 2  . Years of Education: BS   Occupational History  . Not on file.   Social History Main Topics  . Smoking status: Never Smoker   . Smokeless tobacco: Never Used  . Alcohol Use: No  . Drug Use: No  . Sexual Activity: Not on file   Other Topics Concern  . Not on file   Social History Narrative   Patient is divorced.   Patient has two  Children that live with her.   Patient has a Bachelor's degree.   Patient is right-handed.   Patient drinks very little caffeine.             Family History  Problem Relation Age of Onset  . Anesthesia problems Mother     hard to wake up post-op  . Cancer Father     skin  . Heart disease Father   . Cancer Maternal Aunt     breast  . Cancer Paternal Aunt     skin  . Cancer Paternal Grandfather  colon    Past Medical History  Diagnosis Date  . History of idiopathic thrombocytopenic purpura     age 75 - no problems since  . Hypothyroidism   . Family history of anesthesia complication     pt's mother has hx. of being hard to wake up post-op  . ITP (idiopathic thrombocytopenic purpura)   . Abdominal pain     MID EPIGASTRIC TO BACK / RUQ / SHOULDER BLADDER  . Vitamin D deficiency   . Hyperlipidemia   . Endometriosis   . Snoring 11/28/2013    Past Surgical History  Procedure Laterality Date  . Cesarean section  2004    twins  . Thyroidectomy  2001  . Breast lumpectomy Left 2009  . Exploratory laparotomy    . Appendectomy  1995    open appy.  . Laparoscopic assisted vaginal hysterectomy  07/04/2009  . Carpal tunnel release Right 03/14/2013    Procedure: EXPLORE CANAL RIGHT CARPAL;  Surgeon: [name],  MD;  Location: [place];  Service: Orthopedics;  Laterality: Right;  . Cholecystectomy N/A 07/28/2013    Procedure: LAPAROSCOPIC CHOLECYSTECTOMY ;  Surgeon: [name], MD;  Location: [facility];  Service: General;  Laterality: N/A;  . Vaginal hysterectomy      Current Outpatient Prescriptions  Medication Sig Dispense Refill  . EPINEPHrine (EPIPEN 2-PAK) 0.3 mg/0.3 mL IJ SOAJ injection Inject into the muscle once. Use as directed.      [name] levothyroxine (SYNTHROID, LEVOTHROID) 150 MCG tablet Take 150 mcg by mouth daily before breakfast. 150 MCG ON EVEN DAYS, 300 MCG ON ODD DAYS      . mometasone (NASONEX) 50 MCG/ACT nasal spray Place 2 sprays into the nose daily. As needed      . Multiple Vitamin (MULTIVITAMIN WITH MINERALS) TABS tablet Take 1 tablet by mouth daily.      . Vitamin D, Ergocalciferol, (DRISDOL) 50000 UNITS CAPS capsule Take 50,000 Units by mouth every 7 (seven) days.       No current facility-administered medications for this visit.    Allergies as of 11/28/2013 - Review Complete 11/28/2013  Allergen Reaction Noted  . Iodinated diagnostic agents Anaphylaxis 03/07/2013  . Shellfish allergy Anaphylaxis 03/07/2013  . Demerol [meperidine] Other (See Comments) 03/07/2013    Vitals: BP 117/77  Pulse 71  Resp 16  Ht 5' 8.5" (1.74 m)  Wt 214 lb (97.07 kg)  BMI 32.06 kg/m2 Last Weight:  Wt Readings from Last 1 Encounters:  11/28/13 214 lb (97.07 kg)   Last Height:   Ht Readings from Last 1 Encounters:  11/28/13 5' 8.5" (1.74 m)     Physical exam:  General: The patient is awake, alert and appears not in acute distress. The patient is well groomed. Head: Normocephalic, atraumatic. Neck is supple. Mallampati 4, neck circumference:16 inches. No TMJ , small lower jaw.  Cardiovascular:  Regular rate and rhythm, without  murmurs or carotid bruit, and without distended neck veins. Respiratory: Lungs are clear to auscultation. Skin:  Without evidence of edema,  or rash Trunk: BMI is elevated ,  patient  has normal posture.  Neurologic exam : The patient is awake and alert, oriented to place and time.  Memory subjective described as intact. There is a normal attention span & concentration ability. Speech is fluent without dysarthria, dysphonia or aphasia. Mood and affect are appropriate.  Cranial nerves: Pupils are equal and briskly reactive to light. Funduscopic exam without  evidence of pallor or edema.  Extraocular movements  in vertical and horizontal planes intact and without nystagmus. Visual fields by finger perimetry are intact. Hearing to finger rub intact.  Facial sensation intact to fine touch. Facial motor strength is symmetric and tongue and uvula move midline.  Motor exam:  Normal tone and normal muscle bulk and symmetric normal strength in all extremities.  Sensory:  Fine touch, pinprick and vibration were tested in all extremities. Proprioception is  normal.  Coordination: Rapid alternating movements in the fingers/hands is tested and normal. Finger-to-nose maneuver tested and normal without evidence of ataxia, dysmetria or tremor.  Gait and station: Patient walks without assistive device . Deep tendon reflexes: in the  upper and lower extremities are symmetric and intact. Babinski maneuver response is  downgoing.   Assessment:  After physical and neurologic examination, review of laboratory studies, imaging, neurophysiology testing and pre-existing records, assessment is   1) patient with elevated BMI, mallampati and neck size. Witnessed snoring , high degree of fatigue and social stress.       Plan:  Treatment plan and additional workup : SPLIT .

## 2013-11-28 NOTE — Patient Instructions (Signed)
Polysomnography (Sleep Studies) Polysomnography (PSG) is a series of tests used for detecting (diagnosing) obstructive sleep apnea and other sleep disorders. The tests measure how some parts of your body are working while you are sleeping. The tests are extensive and expensive. They are done in a sleep lab or hospital, and vary from center to center. Your caregiver may perform other more simple sleep studies and questionnaires before doing more complete and involved testing. Testing may not be covered by insurance. Some of these tests are:  An EEG (Electroencephalogram). This tests your brain waves and stages of sleep.  An EOG (Electrooculogram). This measures the movements of your eyes. It detects periods of REM (rapid eye movement) sleep, which is your dream sleep.  An EKG (Electrocardiogram). This measures your heart rhythm.  EMG (Electromyography). This is a measurement of how the muscles are working in your upper airway and your legs while sleeping.  An oximetry measurement. It measures how much oxygen (air) you are getting while sleeping.  Breathing efforts may be measured. The same test can be interpreted (understood) differently by different caregivers and centers that study sleep.  Studies may be given an apnea/hypopnea index (AHI). This is a number which is found by counting the times of no breathing or under breathing during the night, and relating those numbers to the amount of time spent in bed. When the AHI is greater than 15, the patient is likely to complain of daytime sleepiness. When the AHI is greater than 30, the patient is at increased risk for heart problems and must be followed more closely. Following the AHI also allows you to know how treatment is working. Simple oximetry (tracking the amount of oxygen that is taken in) can be used for screening patients who:  Do not have symptoms (problems) of OSA.  Have a normal Epworth Sleepiness Scale Score.  Have a low pre-test  probability of having OSA.  Have none of the upper airway problems likely to cause apnea.  Oximetry is also used to determine if treatment is effective in patients who showed significant desaturations (not getting enough oxygen) on their home sleep study. One extra measure of safety is to perform additional studies for the person who only snores. This is because no one can predict with absolute certainty who will have OSA. Those who show significant desaturations (not getting enough oxygen) are recommended to have a more detailed sleep study. Document Released: 01/17/2003 Document Revised: 10/05/2011 Document Reviewed: 07/13/2005 ExitCare Patient Information 2014 [employer].  

## 2013-12-05 ENCOUNTER — Other Ambulatory Visit: Payer: Self-pay | Admitting: Obstetrics and Gynecology

## 2013-12-05 ENCOUNTER — Ambulatory Visit
Admission: RE | Admit: 2013-12-05 | Discharge: 2013-12-05 | Disposition: A | Discharge status: Home / Self Care | Payer: Medicaid Other | Source: Ambulatory Visit | Attending: Obstetrics and Gynecology | Admitting: Obstetrics and Gynecology

## 2013-12-05 DIAGNOSIS — R928 Other abnormal and inconclusive findings on diagnostic imaging of breast: Secondary | ICD-10-CM

## 2013-12-05 DIAGNOSIS — N6320 Unspecified lump in the left breast, unspecified quadrant: Secondary | ICD-10-CM

## 2013-12-12 ENCOUNTER — Other Ambulatory Visit: Payer: Medicaid Other

## 2013-12-13 ENCOUNTER — Ambulatory Visit
Admission: RE | Admit: 2013-12-13 | Discharge: 2013-12-13 | Disposition: A | Discharge status: Home / Self Care | Payer: Medicaid Other | Source: Ambulatory Visit | Attending: Obstetrics and Gynecology | Admitting: Obstetrics and Gynecology

## 2013-12-13 ENCOUNTER — Other Ambulatory Visit: Payer: Self-pay | Admitting: Obstetrics and Gynecology

## 2013-12-13 DIAGNOSIS — N6320 Unspecified lump in the left breast, unspecified quadrant: Secondary | ICD-10-CM

## 2013-12-27 ENCOUNTER — Ambulatory Visit (INDEPENDENT_AMBULATORY_CARE_PROVIDER_SITE_OTHER): Payer: Medicaid Other | Admitting: Surgery

## 2013-12-27 ENCOUNTER — Other Ambulatory Visit (INDEPENDENT_AMBULATORY_CARE_PROVIDER_SITE_OTHER): Payer: Self-pay | Admitting: Surgery

## 2013-12-27 ENCOUNTER — Encounter (INDEPENDENT_AMBULATORY_CARE_PROVIDER_SITE_OTHER): Payer: Self-pay | Admitting: Surgery

## 2013-12-27 VITALS — BP 130/76 | HR 76 | Temp 97.9°F | Ht 67.0 in | Wt 213.0 lb

## 2013-12-27 DIAGNOSIS — N63 Unspecified lump in unspecified breast: Secondary | ICD-10-CM

## 2013-12-27 DIAGNOSIS — N6320 Unspecified lump in the left breast, unspecified quadrant: Secondary | ICD-10-CM

## 2013-12-27 NOTE — Patient Instructions (Signed)
Breast Biopsy  A breast biopsy is a procedure where a sample of breast tissue is removed from your breast. The tissue is examined under a microscope to see if cancerous cells are present. A breast biopsy is done when there is:  · Any undiagnosed breast mass (tumor).  · Nipple abnormalities, dimpling, crusting, or ulcerations.  · Abnormal discharge from the nipple, especially blood.  · Redness, swelling, and pain of the breast.  · Calcium deposits (calcifications) or abnormalities seen on a mammogram, ultrasound result, or results of magnetic resonance imaging (MRI).  · Suspicious changes in the breast seen on your mammogram.  If the tumor is found to be cancerous (malignant), a breast biopsy can help to determine what the best treatment is for you. There are many different types of breast biopsies. Talk to your caregiver about your options and which type is best for you.  LET YOUR CAREGIVER KNOW ABOUT:  · Allergies to food or medicine.  · Medicines taken, including vitamins, herbs, eyedrops, over-the-counter medicines, and creams.  · Use of steroids (by mouth or creams).  · Previous problems with anesthetics or numbing medicines.  · History of bleeding problems or blood clots.  · Previous surgery.  · Other health problems, including diabetes and kidney problems.  · Any recent colds or infections.  · Possibility of pregnancy, if this applies.  RISKS AND COMPLICATIONS   · Bleeding.  · Infection.  · Allergy to medicines.  · Bruising and swelling of the breast.  · Alteration in the shape of the breast.  · Not finding the lump or abnormality.  · Needing more surgery.  BEFORE THE PROCEDURE  · Arrange for someone to drive you home after the procedure.  · Do not smoke for 2 weeks before the procedure. Stop smoking, if you smoke.  · Do not drink alcohol for 24 hours before procedure.  · Wear a good support bra to the procedure.  PROCEDURE   You may be given a medicine to numb the breast area (local anesthesia) or a medicine  to make you sleep (general anesthesia) during the procedure. The following are the different types of biopsies that can be performed.   · Fine-needle aspiration A thin needle is attached to a syringe and inserted into the breast lump. Fluid and cells are removed and then looked at under a microscope. If the breast lump cannot be felt, an ultrasound may be used to help locate the lump and place the needle in the correct area.    · Core needle biopsy A wide, hollow needle (core needle) is inserted into the breast lump 3 6 times to get tissue samples or cores. The samples are removed. The needle is usually placed in the correct area by using an ultrasound or X-ray.    · Stereotactic biopsy X-ray equipment and a computer are used to analyze X-ray pictures of the breast lump. The computer then finds exactly where the core needle needs to be inserted. Tissue samples are removed.    · Vacuum-assisted biopsy A small incision (less than ¼ inch) is made in your breast. A biopsy device that includes a hollow needle and vacuum is passed through the incision and into the breast tissue. The vacuum gently draws abnormal breast tissue into the needle to remove it. This type of biopsy removes a larger tissue sample than a regular core needle biopsy. No stitches are needed, and there is usually little scarring.  · Ultrasound-guided core needle biopsy A high frequency ultrasound helps guide   the core needle to the area of the mass or abnormality. An incision is made to insert the needle. Tissue samples are removed.  · Open biopsy A larger incision is made in the breast. Your caregiver will attempt to remove the whole breast lump or as much as possible.  AFTER THE PROCEDURE  · You will be taken to the recovery area. If you are doing well and have no problems, you will be allowed to go home.  · You may notice bruising on your breast. This is normal.  · Your caregiver may apply a pressure dressing on your breast for 24 48 hours. A  pressure dressing is a bandage that is wrapped tightly around the chest to stop fluid from collecting underneath tissues.  Document Released: 07/13/2005 Document Revised: 11/07/2012 Document Reviewed: 08/13/2011  ExitCare® Patient Information ©2014 [employer].

## 2013-12-27 NOTE — Progress Notes (Signed)
General Surgery - [facility]  Chief Complaint  Patient presents with  . New Evaluation    left breast mass    HISTORY: Patient is a 47-year-old female known to my surgical practice. She underwent recent screening mammography. This showed a new 1.9 cm mass in the upper left breast. Patient has a history of fibrocystic change in his undergone excision of previous fibroadenomas. 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 Patient is now referred for consideration for surgical excision for definitive diagnosis.  Patient has had 2 prior left breast [place]. Both of these were benign. There is a family history of breast disease and the patient's mother who has fibrocystic change. There is a history of breast cancer in a maternal aunt.  Past Medical History  Diagnosis Date  . History of idiopathic thrombocytopenic purpura     age 4 - no problems since  . Hypothyroidism   . Family history of anesthesia complication     pt's mother has hx. of being hard to wake up post-op  . ITP (idiopathic thrombocytopenic purpura)   . Abdominal pain     MID EPIGASTRIC TO BACK / RUQ / SHOULDER BLADDER  . Vitamin D deficiency   . Hyperlipidemia   . Endometriosis   . Snoring 11/28/2013    Current Outpatient Prescriptions  Medication Sig Dispense Refill  . DiphenhydrAMINE HCl, Sleep, 50 MG CAPS Take 1 capsule (50 mg total) by mouth Nightly.  30 each  0  . EPINEPHrine (EPIPEN 2-PAK) 0.3 mg/0.3 mL IJ SOAJ injection Inject into the muscle once. Use as directed.      . levothyroxine (SYNTHROID, LEVOTHROID) 150 MCG tablet Take 150 mcg by mouth daily before breakfast. 150 MCG ON EVEN DAYS, 300 MCG ON ODD DAYS      . mometasone (NASONEX) 50 MCG/ACT nasal spray Place 2 sprays into the nose daily. As needed      . Multiple Vitamin (MULTIVITAMIN WITH MINERALS) TABS  tablet Take 1 tablet by mouth daily.      . Vitamin D, Ergocalciferol, (DRISDOL) 50000 UNITS CAPS capsule Take 50,000 Units by mouth every 7 (seven) days.       No current facility-administered medications for this visit.    Allergies  Allergen Reactions  . Iodinated Diagnostic Agents Anaphylaxis  . Shellfish Allergy Anaphylaxis  . Demerol [Meperidine] Other (See Comments)    HALLUCINATIONS    Family History  Problem Relation Age of Onset  . Anesthesia problems Mother     hard to wake up post-op  . Cancer Father     skin  . Heart disease Father   . Cancer Maternal Aunt     breast  . Cancer Paternal Aunt     skin  . Cancer Paternal Grandfather     colon    History   Social History  . Marital Status: Divorced    Spouse Name: N/A    Number of Children: 2  . Years of Education: BS   Social History Main Topics  . Smoking status: Never Smoker   . Smokeless tobacco: Never Used  . Alcohol Use: No  . Drug Use: No  . Sexual Activity: None   Other Topics Concern  . None   Social History Narrative   Patient is divorced.   Patient has two  Children that live with her.   Patient has a Bachelor's degree.     Patient is right-handed.   Patient drinks very little caffeine.             REVIEW OF SYSTEMS - PERTINENT POSITIVES ONLY: No palpable mass. No pain. No cutaneous changes. No nipple discharge.  EXAM: Filed Vitals:   12/27/13 1011  BP: 130/76  Pulse: 76  Temp: 97.9 F (36.6 C)    GENERAL: well-developed, well-nourished, no acute distress HEENT: normocephalic; pupils equal and reactive; sclerae clear; dentition good; mucous membranes moist NECK:  Well-healed cervical incision; no palpable masses in the thyroid bed; symmetric on extension; no palpable anterior or posterior cervical lymphadenopathy; no supraclavicular masses; no tenderness CHEST: clear to auscultation bilaterally without rales, rhonchi, or wheezes CARDIAC: regular rate and rhythm without  significant murmur; peripheral pulses are full BREAST: Right breast with normal nipple areolar complex; breast parenchyma is diffusely nodular and slightly dense without discrete or dominant mass; left breast also is diffusely nodular without discrete or dominant mass; there is thickening in the upper left breast but not a discrete palpable abnormality; both axillae are free of adenopathy. EXT:  non-tender without edema; no deformity NEURO: no gross focal deficits; no sign of tremor   LABORATORY RESULTS: See [facility] (CHL-Epic) for most recent results  RADIOLOGY RESULTS: See [facility] (CHL-Epic) for most recent results  IMPRESSION: Left breast mass, 1.9 cm, by ultrasound and mammogram examination  PLAN: Patient and I reviewed the study results. We reviewed the pathology results. Patient will require surgical excision with wire localization for definitive diagnosis. We have discussed performing this as an outpatient surgical procedure. In the event of a phyllodes tumor, she may require further surgical resection in order to acquire a clean surgical margin. Patient understands and wishes to proceed.  The risks and benefits of the procedure have been discussed at length with the patient.  The patient understands the proposed procedure, potential alternative treatments, and the course of recovery to be expected.  All of the patient's questions have been answered at this time.  The patient wishes to proceed with surgery.  Angeliah Wisdom M. Alanta Scobey, MD, FACS General & Endocrine Surgery [facility]  Primary Care Physician: SOUTH,STEPHEN ALAN, MD   

## 2014-01-04 ENCOUNTER — Ambulatory Visit (INDEPENDENT_AMBULATORY_CARE_PROVIDER_SITE_OTHER): Payer: Medicaid Other | Admitting: Neurology

## 2014-01-04 VITALS — BP 131/80

## 2014-01-04 DIAGNOSIS — R0683 Snoring: Secondary | ICD-10-CM

## 2014-01-04 DIAGNOSIS — G4733 Obstructive sleep apnea (adult) (pediatric): Secondary | ICD-10-CM

## 2014-01-09 ENCOUNTER — Encounter (HOSPITAL_BASED_OUTPATIENT_CLINIC_OR_DEPARTMENT_OTHER): Payer: Self-pay | Admitting: *Deleted

## 2014-01-09 NOTE — Progress Notes (Signed)
No labs needed

## 2014-01-11 ENCOUNTER — Ambulatory Visit
Admission: RE | Admit: 2014-01-11 | Discharge: 2014-01-11 | Disposition: A | Discharge status: Home / Self Care | Payer: Medicaid Other | Source: Ambulatory Visit | Attending: Surgery | Admitting: Surgery

## 2014-01-11 ENCOUNTER — Telehealth (INDEPENDENT_AMBULATORY_CARE_PROVIDER_SITE_OTHER): Payer: Self-pay

## 2014-01-11 ENCOUNTER — Encounter (HOSPITAL_BASED_OUTPATIENT_CLINIC_OR_DEPARTMENT_OTHER): Payer: Self-pay | Admitting: Anesthesiology

## 2014-01-11 ENCOUNTER — Encounter (HOSPITAL_BASED_OUTPATIENT_CLINIC_OR_DEPARTMENT_OTHER): Payer: Medicaid Other | Admitting: Anesthesiology

## 2014-01-11 ENCOUNTER — Encounter (HOSPITAL_BASED_OUTPATIENT_CLINIC_OR_DEPARTMENT_OTHER)
Admission: RE | Disposition: A | Discharge status: Home / Self Care | Payer: Self-pay | Source: Ambulatory Visit | Attending: Surgery

## 2014-01-11 ENCOUNTER — Ambulatory Visit (HOSPITAL_BASED_OUTPATIENT_CLINIC_OR_DEPARTMENT_OTHER): Payer: Medicaid Other | Admitting: Anesthesiology

## 2014-01-11 ENCOUNTER — Other Ambulatory Visit (INDEPENDENT_AMBULATORY_CARE_PROVIDER_SITE_OTHER): Payer: Self-pay | Admitting: Surgery

## 2014-01-11 ENCOUNTER — Ambulatory Visit (HOSPITAL_BASED_OUTPATIENT_CLINIC_OR_DEPARTMENT_OTHER)
Admission: RE | Admit: 2014-01-11 | Discharge: 2014-01-11 | Disposition: A | Discharge status: Home / Self Care | Payer: Medicaid Other | Source: Ambulatory Visit | Attending: Surgery | Admitting: Surgery

## 2014-01-11 DIAGNOSIS — E039 Hypothyroidism, unspecified: Secondary | ICD-10-CM | POA: Diagnosis not present

## 2014-01-11 DIAGNOSIS — N6320 Unspecified lump in the left breast, unspecified quadrant: Secondary | ICD-10-CM

## 2014-01-11 DIAGNOSIS — N809 Endometriosis, unspecified: Secondary | ICD-10-CM | POA: Insufficient documentation

## 2014-01-11 DIAGNOSIS — D249 Benign neoplasm of unspecified breast: Secondary | ICD-10-CM | POA: Diagnosis not present

## 2014-01-11 DIAGNOSIS — E559 Vitamin D deficiency, unspecified: Secondary | ICD-10-CM | POA: Insufficient documentation

## 2014-01-11 DIAGNOSIS — E785 Hyperlipidemia, unspecified: Secondary | ICD-10-CM | POA: Diagnosis not present

## 2014-01-11 HISTORY — PX: BREAST BIOPSY: SHX20

## 2014-01-11 HISTORY — DX: Presence of spectacles and contact lenses: Z97.3

## 2014-01-11 LAB — POCT HEMOGLOBIN-HEMACUE: Hemoglobin: 15.6 g/dL — ABNORMAL HIGH (ref 12.0–15.0)

## 2014-01-11 SURGERY — BREAST BIOPSY WITH NEEDLE LOCALIZATION
Anesthesia: General | Site: Breast | Laterality: Left

## 2014-01-11 MED ORDER — BUPIVACAINE HCL (PF) 0.5 % IJ SOLN
INTRAMUSCULAR | Status: AC
  Filled 2014-01-11: qty 30

## 2014-01-11 MED ORDER — OXYCODONE HCL 5 MG/5ML PO SOLN: 5.0000 mg | Freq: Once | ORAL | Status: DC | PRN

## 2014-01-11 MED ORDER — MIDAZOLAM HCL 2 MG/2ML IJ SOLN
INTRAMUSCULAR | Status: AC
  Filled 2014-01-11: qty 2

## 2014-01-11 MED ORDER — FENTANYL CITRATE 0.05 MG/ML IJ SOLN
INTRAMUSCULAR | Status: DC | PRN
  Administered 2014-01-11 (×2): 25 ug via INTRAVENOUS
  Administered 2014-01-11: 100 ug via INTRAVENOUS

## 2014-01-11 MED ORDER — FENTANYL CITRATE 0.05 MG/ML IJ SOLN: 50.0000 ug | INTRAMUSCULAR | Status: DC | PRN

## 2014-01-11 MED ORDER — BUPIVACAINE HCL (PF) 0.5 % IJ SOLN
INTRAMUSCULAR | Status: DC | PRN
  Administered 2014-01-11: 3 mL

## 2014-01-11 MED ORDER — OXYCODONE HCL 5 MG PO TABS: 5.0000 mg | ORAL_TABLET | Freq: Once | ORAL | Status: DC | PRN

## 2014-01-11 MED ORDER — CEFAZOLIN SODIUM-DEXTROSE 2-3 GM-% IV SOLR
2.0000 g | INTRAVENOUS | Status: AC
Start: 2014-01-12 — End: 2014-01-11
  Administered 2014-01-11: 2 g via INTRAVENOUS

## 2014-01-11 MED ORDER — FENTANYL CITRATE 0.05 MG/ML IJ SOLN
INTRAMUSCULAR | Status: AC
  Filled 2014-01-11: qty 4

## 2014-01-11 MED ORDER — DEXAMETHASONE SODIUM PHOSPHATE 4 MG/ML IJ SOLN
INTRAMUSCULAR | Status: DC | PRN
  Administered 2014-01-11: 10 mg via INTRAVENOUS

## 2014-01-11 MED ORDER — HYDROMORPHONE HCL PF 1 MG/ML IJ SOLN
INTRAMUSCULAR | Status: AC
  Filled 2014-01-11: qty 1

## 2014-01-11 MED ORDER — LACTATED RINGERS IV SOLN
INTRAVENOUS | Status: DC
  Administered 2014-01-11 (×2): via INTRAVENOUS

## 2014-01-11 MED ORDER — CEFAZOLIN SODIUM-DEXTROSE 2-3 GM-% IV SOLR
INTRAVENOUS | Status: AC
  Filled 2014-01-11: qty 50

## 2014-01-11 MED ORDER — HYDROCODONE-ACETAMINOPHEN 5-325 MG PO TABS: 1.0000 | ORAL_TABLET | ORAL | Status: DC | PRN

## 2014-01-11 MED ORDER — MIDAZOLAM HCL 5 MG/5ML IJ SOLN
INTRAMUSCULAR | Status: DC | PRN
  Administered 2014-01-11: 2 mg via INTRAVENOUS

## 2014-01-11 MED ORDER — PROPOFOL 10 MG/ML IV BOLUS
INTRAVENOUS | Status: DC | PRN
  Administered 2014-01-11: 200 mg via INTRAVENOUS

## 2014-01-11 MED ORDER — HYDROMORPHONE HCL PF 1 MG/ML IJ SOLN
0.2500 mg | INTRAMUSCULAR | Status: DC | PRN
  Administered 2014-01-11 (×2): 0.5 mg via INTRAVENOUS

## 2014-01-11 MED ORDER — LIDOCAINE HCL (CARDIAC) 20 MG/ML IV SOLN
INTRAVENOUS | Status: DC | PRN
  Administered 2014-01-11: 80 mg via INTRAVENOUS

## 2014-01-11 MED ORDER — ONDANSETRON HCL 4 MG/2ML IJ SOLN: 4.0000 mg | Freq: Once | INTRAMUSCULAR | Status: DC | PRN

## 2014-01-11 MED ORDER — LIDOCAINE HCL (PF) 1 % IJ SOLN
INTRAMUSCULAR | Status: AC
  Filled 2014-01-11: qty 30

## 2014-01-11 MED ORDER — MIDAZOLAM HCL 2 MG/2ML IJ SOLN: 1.0000 mg | INTRAMUSCULAR | Status: DC | PRN

## 2014-01-11 SURGICAL SUPPLY — 37 items
APL SKNCLS STERI-STRIP NONHPOA (GAUZE/BANDAGES/DRESSINGS) ×1
BENZOIN TINCTURE PRP APPL 2/3 (GAUZE/BANDAGES/DRESSINGS) ×2 IMPLANT
BLADE SURG 15 STRL LF DISP TIS (BLADE) ×1 IMPLANT
BLADE SURG 15 STRL SS (BLADE) ×2
CANISTER SUCT 1200ML W/VALVE (MISCELLANEOUS) IMPLANT
CHLORAPREP W/TINT 26ML (MISCELLANEOUS) ×2 IMPLANT
CLIP TI WIDE RED SMALL 6 (CLIP) IMPLANT
COVER MAYO STAND STRL (DRAPES) ×2 IMPLANT
COVER TABLE BACK 60X90 (DRAPES) ×2 IMPLANT
DECANTER SPIKE VIAL GLASS SM (MISCELLANEOUS) IMPLANT
DEVICE DUBIN W/COMP PLATE 8390 (MISCELLANEOUS) ×2 IMPLANT
DRAPE PED LAPAROTOMY (DRAPES) ×2 IMPLANT
DRAPE UTILITY XL STRL (DRAPES) ×2 IMPLANT
ELECT REM PT RETURN 9FT ADLT (ELECTROSURGICAL) ×2
ELECTRODE REM PT RTRN 9FT ADLT (ELECTROSURGICAL) ×1 IMPLANT
GLOVE BIO SURGEON STRL SZ 6.5 (GLOVE) ×2 IMPLANT
GLOVE BIOGEL PI IND STRL 7.0 (GLOVE) ×2 IMPLANT
GLOVE BIOGEL PI INDICATOR 7.0 (GLOVE) ×2
GLOVE ECLIPSE 7.0 STRL STRAW (GLOVE) ×2 IMPLANT
GLOVE SURG ORTHO 8.0 STRL STRW (GLOVE) ×2 IMPLANT
GOWN STRL REUS W/ TWL LRG LVL3 (GOWN DISPOSABLE) ×2 IMPLANT
GOWN STRL REUS W/ TWL XL LVL3 (GOWN DISPOSABLE) ×1 IMPLANT
GOWN STRL REUS W/TWL LRG LVL3 (GOWN DISPOSABLE) ×4
GOWN STRL REUS W/TWL XL LVL3 (GOWN DISPOSABLE) ×2
NEEDLE HYPO 25X1 1.5 SAFETY (NEEDLE) ×2 IMPLANT
NS IRRIG 1000ML POUR BTL (IV SOLUTION) ×2 IMPLANT
PACK BASIN DAY SURGERY FS (CUSTOM PROCEDURE TRAY) ×2 IMPLANT
PENCIL BUTTON HOLSTER BLD 10FT (ELECTRODE) ×2 IMPLANT
SLEEVE SCD COMPRESS KNEE MED (MISCELLANEOUS) ×2 IMPLANT
STRIP CLOSURE SKIN 1/2X4 (GAUZE/BANDAGES/DRESSINGS) ×2 IMPLANT
SUT MNCRL AB 4-0 PS2 18 (SUTURE) ×2 IMPLANT
SUT VICRYL 4-0 PS2 18IN ABS (SUTURE) IMPLANT
SYR CONTROL 10ML LL (SYRINGE) ×2 IMPLANT
TOWEL OR 17X24 6PK STRL BLUE (TOWEL DISPOSABLE) ×4 IMPLANT
TOWEL OR NON WOVEN STRL DISP B (DISPOSABLE) ×2 IMPLANT
TUBE CONNECTING 20X1/4 (TUBING) IMPLANT
YANKAUER SUCT BULB TIP NO VENT (SUCTIONS) IMPLANT

## 2014-01-11 NOTE — Anesthesia Procedure Notes (Signed)
Procedure Name: LMA Insertion Date/Time: 01/11/2014 2:52 PM Performed by: Maryella Shivers Pre-anesthesia Checklist: Patient identified, Emergency Drugs available, Suction available and Patient being monitored Patient Re-evaluated:Patient Re-evaluated prior to inductionOxygen Delivery Method: Circle System Utilized Preoxygenation: Pre-oxygenation with 100% oxygen Intubation Type: IV induction Ventilation: Mask ventilation without difficulty LMA: LMA inserted LMA Size: 4.0 Number of attempts: 1 Airway Equipment and Method: bite block Placement Confirmation: positive ETCO2 Tube secured with: Tape Dental Injury: Teeth and Oropharynx as per pre-operative assessment

## 2014-01-11 NOTE — Transfer of Care (Signed)
Immediate Anesthesia Transfer of Care Note  Patient: Miranda [name]  Procedure(s) Performed: Procedure(s): BREAST BIOPSY WITH NEEDLE LOCALIZATION (Left)  Patient Location: PACU  Anesthesia Type:General  Level of Consciousness: sedated  Airway & Oxygen Therapy: Patient Spontanous Breathing and Patient connected to face mask oxygen  Post-op Assessment: Report given to PACU RN and Post -op Vital signs reviewed and stable  Post vital signs: Reviewed and stable  Complications: No apparent anesthesia complications

## 2014-01-11 NOTE — Anesthesia Postprocedure Evaluation (Signed)
  Anesthesia Post-op Note  Patient: [employer]  Procedure(s) Performed: Procedure(s): BREAST BIOPSY WITH NEEDLE LOCALIZATION (Left)  Patient Location: PACU  Anesthesia Type:General  Level of Consciousness: awake, alert  and oriented  Airway and Oxygen Therapy: Patient Spontanous Breathing and Patient connected to face mask oxygen  Post-op Pain: mild  Post-op Assessment: Post-op Vital signs reviewed  Post-op Vital Signs: Reviewed  Last Vitals:  Filed Vitals:   01/11/14 1600  BP: 133/73  Pulse: 79  Temp:   Resp: 13    Complications: No apparent anesthesia complications

## 2014-01-11 NOTE — Telephone Encounter (Signed)
LMOM for pt to call and confirm appt

## 2014-01-11 NOTE — Anesthesia Preprocedure Evaluation (Addendum)
Anesthesia Evaluation  Patient identified by MRN, date of birth, ID band Patient awake    Reviewed: Allergy & Precautions, H&P , NPO status , Patient's Chart, lab work & pertinent test results  Airway  TM Distance: >3 FB Neck ROM: Full    Dental  (+) Teeth Intact, Dental Advisory Given   Pulmonary  breath sounds clear to auscultation        Cardiovascular Rhythm:Regular Rate:Normal     Neuro/Psych    GI/Hepatic   Endo/Other  Morbid obesity  Renal/GU      Musculoskeletal   Abdominal   Peds  Hematology   Anesthesia Other Findings   Reproductive/Obstetrics                          Anesthesia Physical Anesthesia Plan  ASA: I  Anesthesia Plan: General   Post-op Pain Management:    Induction: Intravenous  Airway Management Planned: LMA  Additional Equipment:   Intra-op Plan:   Post-operative Plan: Extubation in OR  Informed Consent: I have reviewed the patients History and Physical, chart, labs and discussed the procedure including the risks, benefits and alternatives for the proposed anesthesia with the patient or authorized representative who has indicated his/her understanding and acceptance.   Dental advisory given  Plan Discussed with: Anesthesiologist, CRNA and Surgeon  Anesthesia Plan Comments:         Anesthesia Quick Evaluation

## 2014-01-11 NOTE — Interval H&P Note (Signed)
History and Physical Interval Note:  01/11/2014 2:43 PM  Miranda [name]  has presented today for surgery, with the diagnosis of left breast mass.  The various methods of treatment have been discussed with the patient and family. After consideration of risks, benefits and other options for treatment, the patient has consented to    Procedure(s): BREAST BIOPSY WITH NEEDLE LOCALIZATION (Left) as a surgical intervention .    The patient's history has been reviewed, patient examined, no change in status, stable for surgery.  I have reviewed the patient's chart and labs.  Questions were answered to the patient's satisfaction.    [name], MD, [facility] Office: [place]

## 2014-01-11 NOTE — H&P (View-Only) (Signed)
General Surgery [facility]  Chief Complaint  Patient presents with  . New Evaluation    left breast mass    HISTORY: Patient is a 48 year old female known to my surgical practice. She underwent recent screening mammography. This showed a new 1.9 cm mass in the upper left breast. Patient has a history of fibrocystic change in his undergone excision of previous fibroadenomas. 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 Patient is now referred for consideration for surgical excision for definitive diagnosis.  Patient has had 2 prior left breast biopsies [place] out of state. Both of these were benign. There is a family history of breast disease and the patient's mother who has fibrocystic change. There is a history of breast cancer in a maternal aunt.  Past Medical History  Diagnosis Date  . History of idiopathic thrombocytopenic purpura     age 88 - no problems since  . Hypothyroidism   . Family history of anesthesia complication     pt's mother has hx. of being hard to wake up post-op  . ITP (idiopathic thrombocytopenic purpura)   . Abdominal pain     MID EPIGASTRIC TO BACK / RUQ / SHOULDER BLADDER  . Vitamin D deficiency   . Hyperlipidemia   . Endometriosis   . Snoring 11/28/2013    Current Outpatient Prescriptions  Medication Sig Dispense Refill  . DiphenhydrAMINE HCl, Sleep, 50 MG CAPS Take 1 capsule (50 mg total) by mouth Nightly.  30 each  0  . EPINEPHrine (EPIPEN 2-PAK) 0.3 mg/0.3 mL IJ SOAJ injection Inject into the muscle once. Use as directed.      [name] levothyroxine (SYNTHROID, LEVOTHROID) 150 MCG tablet Take 150 mcg by mouth daily before breakfast. 150 MCG ON EVEN DAYS, 300 MCG ON ODD DAYS      . mometasone (NASONEX) 50 MCG/ACT nasal spray Place 2 sprays into the nose daily. As needed      . Multiple Vitamin (MULTIVITAMIN WITH MINERALS) TABS  tablet Take 1 tablet by mouth daily.      . Vitamin D, Ergocalciferol, (DRISDOL) 50000 UNITS CAPS capsule Take 50,000 Units by mouth every 7 (seven) days.       No current facility-administered medications for this visit.    Allergies  Allergen Reactions  . Iodinated Diagnostic Agents Anaphylaxis  . Shellfish Allergy Anaphylaxis  . Demerol [Meperidine] Other (See Comments)    HALLUCINATIONS    Family History  Problem Relation Age of Onset  . Anesthesia problems Mother     hard to wake up post-op  . Cancer Father     skin  . Heart disease Father   . Cancer Maternal Aunt     breast  . Cancer Paternal Aunt     skin  . Cancer Paternal Grandfather     colon    History   Social History  . Marital Status: Divorced    Spouse Name: N/A    Number of Children: 2  . Years of Education: BS   Social History Main Topics  . Smoking status: Never Smoker   . Smokeless tobacco: Never Used  . Alcohol Use: No  . Drug Use: No  . Sexual Activity: None   Other Topics Concern  . None   Social History Narrative   Patient is divorced.   Patient has two  Children that live with her.   Patient has a Bachelor's degree.  Patient is right-handed.   Patient drinks very little caffeine.             REVIEW OF SYSTEMS - PERTINENT POSITIVES ONLY: No palpable mass. No pain. No cutaneous changes. No nipple discharge.  EXAM: Filed Vitals:   12/27/13 1011  BP: 130/76  Pulse: 76  Temp: 97.9 F (36.6 C)    GENERAL: well-developed, well-nourished, no acute distress HEENT: normocephalic; pupils equal and reactive; sclerae clear; dentition good; mucous membranes moist NECK:  Well-healed cervical incision; no palpable masses in the thyroid bed; symmetric on extension; no palpable anterior or posterior cervical lymphadenopathy; no supraclavicular masses; no tenderness CHEST: clear to auscultation bilaterally without rales, rhonchi, or wheezes CARDIAC: regular rate and rhythm without  significant murmur; peripheral pulses are full BREAST: Right breast with normal nipple areolar complex; breast parenchyma is diffusely nodular and slightly dense without discrete or dominant mass; left breast also is diffusely nodular without discrete or dominant mass; there is thickening in the upper left breast but not a discrete palpable abnormality; both axillae are free of adenopathy. EXT:  non-tender without edema; no deformity NEURO: no gross focal deficits; no sign of tremor   LABORATORY RESULTS: See [facility] (CHL-Epic) for most recent results  RADIOLOGY RESULTS: See [facility] (CHL-Epic) for most recent results  IMPRESSION: Left breast mass, 1.9 cm, by ultrasound and mammogram examination  PLAN: Patient and I reviewed the study results. We reviewed the pathology results. Patient will require surgical excision with wire localization for definitive diagnosis. We have discussed performing this as an outpatient surgical procedure. In the event of a phyllodes tumor, she may require further surgical resection in order to acquire a clean surgical margin. Patient understands and wishes to proceed.  The risks and benefits of the procedure have been discussed at length with the patient.  The patient understands the proposed procedure, potential alternative treatments, and the course of recovery to be expected.  All of the patient's questions have been answered at this time.  The patient wishes to proceed with surgery.  [name], MD, [facility]  Primary Care Physician: Sheela Stack, MD

## 2014-01-11 NOTE — Discharge Instructions (Signed)

## 2014-01-11 NOTE — Brief Op Note (Signed)
01/11/2014  3:52 PM  PATIENT:  Miranda [name]  48 y.o. female  PRE-OPERATIVE DIAGNOSIS:  left breast mass  POST-OPERATIVE DIAGNOSIS:  left breast mass  PROCEDURE:  Procedure(s): BREAST BIOPSY WITH NEEDLE LOCALIZATION (Left)  SURGEON:  Surgeon(s) and Role:    * [name], MD - Primary  ANESTHESIA:   general  EBL:  Total I/O In: 1300 [I.V.:1300] Out: -   BLOOD ADMINISTERED:none  DRAINS: none   LOCAL MEDICATIONS USED:  MARCAINE     SPECIMEN:  Excision  DISPOSITION OF SPECIMEN:  PATHOLOGY  COUNTS:  YES  TOURNIQUET:  * No tourniquets in log *  DICTATION: .Other Dictation: Dictation Number [id number]  PLAN OF CARE: Admit for overnight observation  PATIENT DISPOSITION:  PACU - hemodynamically stable.   Delay start of Pharmacological VTE agent (>24hrs) due to surgical blood loss or risk of bleeding: yes  [name], MD, [facility] Office: [phone]

## 2014-01-12 ENCOUNTER — Encounter (HOSPITAL_BASED_OUTPATIENT_CLINIC_OR_DEPARTMENT_OTHER): Payer: Self-pay | Admitting: Surgery

## 2014-01-12 NOTE — Op Note (Signed)
NAMECHRISY, Miranda [name]                 ACCOUNT NO.:  [id number]  MEDICAL RECORD NO.:  [id number]  LOCATION:                                FACILITY:  [facility]  PHYSICIAN:  [name], MD      DATE OF BIRTH:  24-Jun-1966  DATE OF PROCEDURE:  01/11/2014                              OPERATIVE REPORT   PREOPERATIVE DIAGNOSIS:  Left breast mass.  POSTOPERATIVE DIAGNOSIS:  Left breast mass.  PROCEDURE:  Left breast excisional biopsy with wire localization.  SURGEON:  [name], MD, FACS  ANESTHESIA:  General.  ESTIMATED BLOOD LOSS:  Minimal.  PREPARATION:  ChloraPrep.  COMPLICATIONS:  None.  INDICATIONS:  The patient is a 48 year old female who underwent screening mammography.  This showed a 1.9-cm mass in the upper left breast.  The patient has a history of fibrocystic change.  She has had previous excision of fibroadenomas.  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  The patient now comes to Surgery for excision for definitive diagnosis.  BODY OF REPORT:  Procedure was done in OR #3 at the [facility].  The patient was brought to the operating room and placed in supine position on the operating room table.  Following administration of general anesthesia, the patient was positioned and then prepped and draped in the usual aseptic fashion.  After ascertaining that an adequate level of anesthesia had been achieved, the skin was anesthetized with local anesthetic.  A curvilinear incision was made in the upper outer quadrant of the left breast at the site of guidewire insertion.  Using the guidewire for direction, a core of tissue was excised using the electrocautery for hemostasis.  Dissection was carried along the guidewire down to nearly the chest wall.  At this point, a mass was palpable and visually identifiable.  It was excised off of the underlying pectoralis major muscle.   The entire core of tissue was removed and a specimen mammogram performed.  This confirmed that the marking clip previously placed is within the specimen.  Specimen was then submitted to Pathology for review.  Wound was irrigated with warm saline.  Good hemostasis was noted. Subcutaneous tissues were closed with interrupted 3-0 Vicryl sutures. Skin was closed with a running 4-0 Monocryl subcuticular suture.  Wound was washed and dried, and benzoin and Steri-Strips were applied. Sterile dressings were applied.  The patient was awakened from anesthesia and brought to the recovery room.  The patient tolerated the procedure well.   [name], MD, [facility] Office: [phone]    TMG/MEDQ  D:  01/11/2014  T:  01/11/2014  Job:  [id number]  cc:   Annie Main A. Forde Dandy, M.D.

## 2014-01-16 ENCOUNTER — Telehealth (INDEPENDENT_AMBULATORY_CARE_PROVIDER_SITE_OTHER): Payer: Self-pay

## 2014-01-16 NOTE — Telephone Encounter (Signed)
Pt s/p left breast excisional biopsy on 01/11/14. Pt states that she has started having a rash around the incision site. Advised pt that this maybe due to the [place]. Advised pt that we like for the steristrips to stay on for 7 days. Advised pt that she can take Benadryl and place hydrocortisone cream around the incision, however not to place anything on the incision itself. Pt denies any swelling, fevers, or chills. Informed pt to call us back if area becomes red, swollen or tender to touch. Pt verbalized understanding and agrees with POC.

## 2014-01-18 ENCOUNTER — Encounter: Payer: Self-pay | Admitting: *Deleted

## 2014-01-18 ENCOUNTER — Telehealth: Payer: Self-pay | Admitting: Neurology

## 2014-01-18 DIAGNOSIS — G4733 Obstructive sleep apnea (adult) (pediatric): Secondary | ICD-10-CM

## 2014-01-18 NOTE — Telephone Encounter (Signed)
I called and left a message for the patient about her recent sleep study results. I informed the patient that the study revealed mild obstructive sleep apnea and that Dr. Brett Fairy recommend CPAP therapy. This will require a repeat study for the proper titration and mask fitting. I will fax a copy of the report to Dr. Lorain Childes office and mail a copy to the patient. I have asked the patient to callback to schedule her CPAP titration study.

## 2014-01-24 ENCOUNTER — Encounter (HOSPITAL_COMMUNITY): Payer: Self-pay

## 2014-01-24 ENCOUNTER — Telehealth (INDEPENDENT_AMBULATORY_CARE_PROVIDER_SITE_OTHER): Payer: Self-pay

## 2014-01-24 DIAGNOSIS — D493 Neoplasm of unspecified behavior of breast: Secondary | ICD-10-CM

## 2014-01-24 HISTORY — DX: Neoplasm of unspecified behavior of breast: D49.3

## 2014-01-24 NOTE — Telephone Encounter (Signed)
Pt calling to see if path results were back from her bx that she had done on 01/11/14. Advised pt that I did not see any path results in the computer. Pt states that she spoke with someone last week and they explained to her that they had sent them out for a second option. Pt states that she has appt to see Dr Harlow Asa on 01/29/14 but she was hoping to have some results before her appt date. Informed pt that I would bring this to Dr Gala Lewandowsky attention. Pt verbalized understanding.

## 2014-01-25 ENCOUNTER — Telehealth (INDEPENDENT_AMBULATORY_CARE_PROVIDER_SITE_OTHER): Payer: Self-pay

## 2014-01-25 NOTE — Telephone Encounter (Signed)
Pt notified of path result and margins are negative. I advised pt that Dr Harlow Asa will review this pathology and advise of any follow up plan.

## 2014-01-29 ENCOUNTER — Ambulatory Visit (INDEPENDENT_AMBULATORY_CARE_PROVIDER_SITE_OTHER): Payer: Medicaid Other | Admitting: Surgery

## 2014-01-29 ENCOUNTER — Encounter (INDEPENDENT_AMBULATORY_CARE_PROVIDER_SITE_OTHER): Payer: Self-pay | Admitting: Surgery

## 2014-01-29 VITALS — BP 126/78 | HR 78 | Temp 98.5°F | Ht 68.0 in | Wt 219.0 lb

## 2014-01-29 DIAGNOSIS — N6320 Unspecified lump in the left breast, unspecified quadrant: Secondary | ICD-10-CM

## 2014-01-29 DIAGNOSIS — N63 Unspecified lump in unspecified breast: Secondary | ICD-10-CM

## 2014-01-29 NOTE — Progress Notes (Signed)
General Surgery [facility]  Chief Complaint  Patient presents with  . Routine Post Op    breast biopsy - excision of phyllodes tumor    HISTORY: The patient is a 48 year old female who underwent wire localized excision of a left breast mass. Pathology was sent to [facility] Department of pathology for review. 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  However the tumor does extend to the inked margin at one location. There is not a 1 cm margin circumferentially.  Postoperatively the patient had a cutaneous reaction to the Steri-Strips and adhesive.  EXAM: Surgical incision is healing nicely. Mild soft tissue swelling. Slight remaining cutaneous erythema and distribution of Steri-Strips and benzoin. No significant seroma palpable. No sign of infection.  IMPRESSION: Left breast phyllodes tumor, 2.1 cm, benign  PLAN: The patient and I review the pathology report in detail. I provided her with a copy. My review of the literature states that a 1 cm margin is recommended to limit the risk of recurrence locally. We certainly did not have a 1 cm margin, although the surgical margins do appear negative.  After discussion, the patient would like to proceed with reexcision in order to achieve a 1 cm margin. This should decrease her risk of recurrence from 30% to less than 10%. This can be performed as an outpatient surgical procedure.  We will plan to use Dermabond and stent of Steri-Strips for wound dressing.  The risks and benefits of the procedure have been discussed at length with the patient.  The patient understands the proposed procedure, potential alternative treatments, and the course of recovery to be expected.  All of the patient's questions have been answered at this time.  The patient wishes to proceed with surgery.  [name], MD, [facility]   Visit  Diagnoses: 1. Breast mass, left

## 2014-02-01 ENCOUNTER — Encounter (HOSPITAL_BASED_OUTPATIENT_CLINIC_OR_DEPARTMENT_OTHER): Payer: Self-pay | Admitting: *Deleted

## 2014-02-01 NOTE — Progress Notes (Signed)
For re-exc-no labs needed

## 2014-02-05 ENCOUNTER — Encounter (HOSPITAL_BASED_OUTPATIENT_CLINIC_OR_DEPARTMENT_OTHER): Payer: Medicaid Other | Admitting: Anesthesiology

## 2014-02-05 ENCOUNTER — Ambulatory Visit (HOSPITAL_BASED_OUTPATIENT_CLINIC_OR_DEPARTMENT_OTHER)
Admission: RE | Admit: 2014-02-05 | Discharge: 2014-02-05 | Disposition: A | Discharge status: Home / Self Care | Payer: Medicaid Other | Source: Ambulatory Visit | Attending: Surgery | Admitting: Surgery

## 2014-02-05 ENCOUNTER — Encounter (HOSPITAL_BASED_OUTPATIENT_CLINIC_OR_DEPARTMENT_OTHER): Payer: Self-pay | Admitting: *Deleted

## 2014-02-05 ENCOUNTER — Encounter (HOSPITAL_BASED_OUTPATIENT_CLINIC_OR_DEPARTMENT_OTHER)
Admission: RE | Disposition: A | Discharge status: Home / Self Care | Payer: Self-pay | Source: Ambulatory Visit | Attending: Surgery

## 2014-02-05 ENCOUNTER — Ambulatory Visit (HOSPITAL_BASED_OUTPATIENT_CLINIC_OR_DEPARTMENT_OTHER): Payer: Medicaid Other | Admitting: Anesthesiology

## 2014-02-05 DIAGNOSIS — N63 Unspecified lump in unspecified breast: Secondary | ICD-10-CM | POA: Diagnosis present

## 2014-02-05 DIAGNOSIS — D249 Benign neoplasm of unspecified breast: Secondary | ICD-10-CM | POA: Diagnosis not present

## 2014-02-05 DIAGNOSIS — N6320 Unspecified lump in the left breast, unspecified quadrant: Secondary | ICD-10-CM | POA: Diagnosis present

## 2014-02-05 HISTORY — PX: RE-EXCISION OF BREAST LUMPECTOMY: SHX6048

## 2014-02-05 LAB — POCT HEMOGLOBIN-HEMACUE: Hemoglobin: 14.2 g/dL (ref 12.0–15.0)

## 2014-02-05 SURGERY — EXCISION, LESION, BREAST
Anesthesia: General | Site: Breast | Laterality: Left

## 2014-02-05 MED ORDER — BUPIVACAINE HCL (PF) 0.5 % IJ SOLN
INTRAMUSCULAR | Status: AC
  Filled 2014-02-05: qty 30

## 2014-02-05 MED ORDER — FENTANYL CITRATE 0.05 MG/ML IJ SOLN: 50.0000 ug | INTRAMUSCULAR | Status: DC | PRN

## 2014-02-05 MED ORDER — SCOPOLAMINE 1 MG/3DAYS TD PT72
MEDICATED_PATCH | TRANSDERMAL | Status: AC
  Filled 2014-02-05: qty 1

## 2014-02-05 MED ORDER — HYDROCODONE-ACETAMINOPHEN 10-325 MG PO TABS: 1.0000 | ORAL_TABLET | ORAL | Status: DC | PRN

## 2014-02-05 MED ORDER — MIDAZOLAM HCL 2 MG/2ML IJ SOLN
INTRAMUSCULAR | Status: AC
  Filled 2014-02-05: qty 2

## 2014-02-05 MED ORDER — HYDROMORPHONE HCL PF 1 MG/ML IJ SOLN
INTRAMUSCULAR | Status: AC
  Filled 2014-02-05: qty 1

## 2014-02-05 MED ORDER — MIDAZOLAM HCL 5 MG/5ML IJ SOLN
INTRAMUSCULAR | Status: DC | PRN
  Administered 2014-02-05: 2 mg via INTRAVENOUS

## 2014-02-05 MED ORDER — OXYCODONE HCL 5 MG/5ML PO SOLN: 5.0000 mg | Freq: Once | ORAL | Status: AC | PRN

## 2014-02-05 MED ORDER — DEXAMETHASONE SODIUM PHOSPHATE 4 MG/ML IJ SOLN
INTRAMUSCULAR | Status: DC | PRN
  Administered 2014-02-05: 10 mg via INTRAVENOUS

## 2014-02-05 MED ORDER — MIDAZOLAM HCL 2 MG/2ML IJ SOLN: 1.0000 mg | INTRAMUSCULAR | Status: DC | PRN

## 2014-02-05 MED ORDER — LIDOCAINE HCL (CARDIAC) 20 MG/ML IV SOLN
INTRAVENOUS | Status: DC | PRN
  Administered 2014-02-05: 50 mg via INTRAVENOUS

## 2014-02-05 MED ORDER — SCOPOLAMINE 1 MG/3DAYS TD PT72
1.0000 | MEDICATED_PATCH | TRANSDERMAL | Status: DC
  Administered 2014-02-05: 1.5 mg via TRANSDERMAL

## 2014-02-05 MED ORDER — OXYCODONE HCL 5 MG PO TABS
5.0000 mg | ORAL_TABLET | Freq: Once | ORAL | Status: AC | PRN
  Administered 2014-02-05: 5 mg via ORAL

## 2014-02-05 MED ORDER — BUPIVACAINE HCL (PF) 0.5 % IJ SOLN
INTRAMUSCULAR | Status: DC | PRN
  Administered 2014-02-05: 12 mL

## 2014-02-05 MED ORDER — FENTANYL CITRATE 0.05 MG/ML IJ SOLN
INTRAMUSCULAR | Status: DC | PRN
Start: 2014-02-05 — End: 2014-02-05
  Administered 2014-02-05 (×3): 50 ug via INTRAVENOUS

## 2014-02-05 MED ORDER — ONDANSETRON HCL 4 MG/2ML IJ SOLN
INTRAMUSCULAR | Status: DC | PRN
  Administered 2014-02-05: 4 mg via INTRAVENOUS

## 2014-02-05 MED ORDER — PROPOFOL 10 MG/ML IV BOLUS
INTRAVENOUS | Status: DC | PRN
  Administered 2014-02-05: 100 mg via INTRAVENOUS
  Administered 2014-02-05: 200 mg via INTRAVENOUS
  Administered 2014-02-05: 100 mg via INTRAVENOUS

## 2014-02-05 MED ORDER — LACTATED RINGERS IV SOLN
INTRAVENOUS | Status: DC
Start: 2014-02-05 — End: 2014-02-05
  Administered 2014-02-05 (×3): via INTRAVENOUS

## 2014-02-05 MED ORDER — PHENYLEPHRINE HCL 10 MG/ML IJ SOLN
INTRAMUSCULAR | Status: DC | PRN
  Administered 2014-02-05: 80 ug via INTRAVENOUS

## 2014-02-05 MED ORDER — ONDANSETRON HCL 4 MG/2ML IJ SOLN: 4.0000 mg | Freq: Once | INTRAMUSCULAR | Status: DC | PRN

## 2014-02-05 MED ORDER — FENTANYL CITRATE 0.05 MG/ML IJ SOLN
INTRAMUSCULAR | Status: AC
  Filled 2014-02-05: qty 6

## 2014-02-05 MED ORDER — CEFAZOLIN SODIUM-DEXTROSE 2-3 GM-% IV SOLR
2.0000 g | INTRAVENOUS | Status: AC
  Administered 2014-02-05: 2 g via INTRAVENOUS

## 2014-02-05 MED ORDER — OXYCODONE HCL 5 MG PO TABS
ORAL_TABLET | ORAL | Status: AC
  Filled 2014-02-05: qty 1

## 2014-02-05 MED ORDER — HYDROMORPHONE HCL PF 1 MG/ML IJ SOLN
0.2500 mg | INTRAMUSCULAR | Status: DC | PRN
  Administered 2014-02-05 (×4): 0.5 mg via INTRAVENOUS

## 2014-02-05 SURGICAL SUPPLY — 44 items
ADH SKN CLS APL DERMABOND .7 (GAUZE/BANDAGES/DRESSINGS) ×1
APL SKNCLS STERI-STRIP NONHPOA (GAUZE/BANDAGES/DRESSINGS)
BENZOIN TINCTURE PRP APPL 2/3 (GAUZE/BANDAGES/DRESSINGS) IMPLANT
BLADE SURG 15 STRL LF DISP TIS (BLADE) ×1 IMPLANT
BLADE SURG 15 STRL SS (BLADE) ×3
CANISTER SUCT 1200ML W/VALVE (MISCELLANEOUS) ×3 IMPLANT
CHLORAPREP W/TINT 26ML (MISCELLANEOUS) ×3 IMPLANT
CLIP TI WIDE RED SMALL 6 (CLIP) IMPLANT
CLOSURE WOUND 1/2 X4 (GAUZE/BANDAGES/DRESSINGS) ×1
COVER MAYO STAND STRL (DRAPES) ×3 IMPLANT
COVER TABLE BACK 60X90 (DRAPES) ×3 IMPLANT
DECANTER SPIKE VIAL GLASS SM (MISCELLANEOUS) IMPLANT
DERMABOND ADVANCED (GAUZE/BANDAGES/DRESSINGS) ×2
DERMABOND ADVANCED .7 DNX12 (GAUZE/BANDAGES/DRESSINGS) ×1 IMPLANT
DEVICE DUBIN W/COMP PLATE 8390 (MISCELLANEOUS) IMPLANT
DRAPE PED LAPAROTOMY (DRAPES) ×3 IMPLANT
DRAPE UTILITY XL STRL (DRAPES) ×3 IMPLANT
ELECT REM PT RETURN 9FT ADLT (ELECTROSURGICAL) ×3
ELECTRODE REM PT RTRN 9FT ADLT (ELECTROSURGICAL) ×1 IMPLANT
GLOVE SURG ORTHO 8.0 STRL STRW (GLOVE) ×3 IMPLANT
GLOVE SURG SS PI 7.0 STRL IVOR (GLOVE) ×3 IMPLANT
GOWN STRL REUS W/ TWL LRG LVL3 (GOWN DISPOSABLE) ×1 IMPLANT
GOWN STRL REUS W/ TWL XL LVL3 (GOWN DISPOSABLE) ×1 IMPLANT
GOWN STRL REUS W/TWL LRG LVL3 (GOWN DISPOSABLE) ×3
GOWN STRL REUS W/TWL XL LVL3 (GOWN DISPOSABLE) ×3
KIT MARKER MARGIN INK (KITS) IMPLANT
NEEDLE HYPO 25X1 1.5 SAFETY (NEEDLE) ×3 IMPLANT
NS IRRIG 1000ML POUR BTL (IV SOLUTION) ×3 IMPLANT
PACK BASIN DAY SURGERY FS (CUSTOM PROCEDURE TRAY) ×3 IMPLANT
PENCIL BUTTON HOLSTER BLD 10FT (ELECTRODE) ×3 IMPLANT
SLEEVE SCD COMPRESS KNEE MED (MISCELLANEOUS) ×3 IMPLANT
SPONGE LAP 4X18 X RAY DECT (DISPOSABLE) ×3 IMPLANT
STRIP CLOSURE SKIN 1/2X4 (GAUZE/BANDAGES/DRESSINGS) ×2 IMPLANT
SUT MNCRL AB 4-0 PS2 18 (SUTURE) ×3 IMPLANT
SUT SILK 2 0 TIES 17X18 (SUTURE) ×3
SUT SILK 2-0 18XBRD TIE BLK (SUTURE) ×1 IMPLANT
SUT VICRYL 3-0 CR8 SH (SUTURE) ×3 IMPLANT
SUT VICRYL 4-0 PS2 18IN ABS (SUTURE) IMPLANT
SYRINGE CONTROL L 12CC (SYRINGE) ×3 IMPLANT
TOWEL OR 17X24 6PK STRL BLUE (TOWEL DISPOSABLE) ×3 IMPLANT
TOWEL OR NON WOVEN STRL DISP B (DISPOSABLE) IMPLANT
TUBE CONNECTING 20'X1/4 (TUBING) ×1
TUBE CONNECTING 20X1/4 (TUBING) ×2 IMPLANT
YANKAUER SUCT BULB TIP NO VENT (SUCTIONS) ×3 IMPLANT

## 2014-02-05 NOTE — Anesthesia Preprocedure Evaluation (Signed)

## 2014-02-05 NOTE — Discharge Instructions (Signed)

## 2014-02-05 NOTE — Brief Op Note (Signed)
02/05/2014  1:51 PM  PATIENT:  Miranda [name]  48 y.o. female  PRE-OPERATIVE DIAGNOSIS:  Left breast mass (phyllodes tumor)  POST-OPERATIVE DIAGNOSIS:  same  PROCEDURE:  Procedure(s): RE-EXCISION OF LEFT BREAST MASS (Left)  SURGEON:  Surgeon(s) and Role:    * [name], MD - Primary  ANESTHESIA:   general  EBL:  Total I/O In: 1000 [I.V.:1000] Out: -   BLOOD ADMINISTERED:none  DRAINS: none   LOCAL MEDICATIONS USED:  MARCAINE     SPECIMEN:  Excision  DISPOSITION OF SPECIMEN:  PATHOLOGY  COUNTS:  YES  TOURNIQUET:  * No tourniquets in log *  DICTATION: .Other Dictation: Dictation Number [id number]  PLAN OF CARE: Discharge to home after PACU  PATIENT DISPOSITION:  PACU - hemodynamically stable.   Delay start of Pharmacological VTE agent (>24hrs) due to surgical blood loss or risk of bleeding: yes  [name], MD, [facility] Office: [phone]

## 2014-02-05 NOTE — H&P (View-Only) (Signed)
General Surgery [facility]  Chief Complaint  Patient presents with  . Routine Post Op    breast biopsy - excision of phyllodes tumor    HISTORY: The patient is a 47 year old female who underwent wire localized excision of a left breast mass. Pathology was sent to [facility] Department of pathology for review. 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  However the tumor does extend to the inked margin at one location. There is not a 1 cm margin circumferentially.  Postoperatively the patient had a cutaneous reaction to the Steri-Strips and adhesive.  EXAM: Surgical incision is healing nicely. Mild soft tissue swelling. Slight remaining cutaneous erythema and distribution of Steri-Strips and benzoin. No significant seroma palpable. No sign of infection.  IMPRESSION: Left breast phyllodes tumor, 2.1 cm, benign  PLAN: The patient and I review the pathology report in detail. I provided her with a copy. My review of the literature states that a 1 cm margin is recommended to limit the risk of recurrence locally. We certainly did not have a 1 cm margin, although the surgical margins do appear negative.  After discussion, the patient would like to proceed with reexcision in order to achieve a 1 cm margin. This should decrease her risk of recurrence from 30% to less than 10%. This can be performed as an outpatient surgical procedure.  We will plan to use Dermabond and stent of Steri-Strips for wound dressing.  The risks and benefits of the procedure have been discussed at length with the patient.  The patient understands the proposed procedure, potential alternative treatments, and the course of recovery to be expected.  All of the patient's questions have been answered at this time.  The patient wishes to proceed with surgery.  [name], MD, [facility]   Visit  Diagnoses: 1. Breast mass, left

## 2014-02-05 NOTE — Transfer of Care (Signed)
Immediate Anesthesia Transfer of Care Note  Patient: Malajah Crist  Procedure(s) Performed: Procedure(s): RE-EXCISION OF LEFT BREAST MASS (Left)  Patient Location: PACU  Anesthesia Type:General  Level of Consciousness: awake and alert   Airway & Oxygen Therapy: Patient Spontanous Breathing and Patient connected to face mask oxygen  Post-op Assessment: Report given to PACU RN and Post -op Vital signs reviewed and stable  Post vital signs: Reviewed and stable  Complications: No apparent anesthesia complications

## 2014-02-05 NOTE — Anesthesia Postprocedure Evaluation (Signed)
  Anesthesia Post-op Note  Patient: Miranda [name]  Procedure(s) Performed: Procedure(s): RE-EXCISION OF LEFT BREAST MASS (Left)  Patient Location: PACU  Anesthesia Type: General   Level of Consciousness: awake, alert  and oriented  Airway and Oxygen Therapy: Patient Spontanous Breathing  Post-op Pain: mild  Post-op Assessment: Post-op Vital signs reviewed  Post-op Vital Signs: Reviewed  Last Vitals:  Filed Vitals:   02/05/14 1445  BP: 111/55  Pulse: 64  Temp:   Resp:     Complications: No apparent anesthesia complications  Anesthesia Post-op Note  Patient: Miranda [name]  Procedure(s) Performed: Procedure(s): RE-EXCISION OF LEFT BREAST MASS (Left)  Patient Location: PACU  Anesthesia Type: General   Level of Consciousness: awake, alert  and oriented  Airway and Oxygen Therapy: Patient Spontanous Breathing  Post-op Pain: mild  Post-op Assessment: Post-op Vital signs reviewed  Post-op Vital Signs: Reviewed  Last Vitals:  Filed Vitals:   02/05/14 1445  BP: 111/55  Pulse: 64  Temp:   Resp:     Complications: No apparent anesthesia complications

## 2014-02-05 NOTE — Interval H&P Note (Signed)
History and Physical Interval Note:  02/05/2014 12:39 PM  Miranda [name]  has presented today for surgery, with the diagnosis of breast mass.  The various methods of treatment have been discussed with the patient and family. After consideration of risks, benefits and other options for treatment, the patient has consented to    Procedure(s): RE-EXCISION OF LEFT BREAST MASS (Left) as a surgical intervention .    The patient's history has been reviewed, patient examined, no change in status, stable for surgery.  I have reviewed the patient's chart and labs.  Questions were answered to the patient's satisfaction.    [name], MD, [facility] Office: [place]

## 2014-02-05 NOTE — Anesthesia Procedure Notes (Signed)
Procedure Name: LMA Insertion Date/Time: 02/05/2014 12:54 PM Performed by: Toula Moos L Pre-anesthesia Checklist: Patient identified, Emergency Drugs available, Suction available, Patient being monitored and Timeout performed Patient Re-evaluated:Patient Re-evaluated prior to inductionOxygen Delivery Method: Circle System Utilized Preoxygenation: Pre-oxygenation with 100% oxygen Intubation Type: IV induction Ventilation: Mask ventilation without difficulty LMA: LMA inserted LMA Size: 4.0 Number of attempts: 1 Airway Equipment and Method: bite block Placement Confirmation: positive ETCO2 and breath sounds checked- equal and bilateral Tube secured with: Tape Dental Injury: Teeth and Oropharynx as per pre-operative assessment

## 2014-02-06 ENCOUNTER — Telehealth (INDEPENDENT_AMBULATORY_CARE_PROVIDER_SITE_OTHER): Payer: Self-pay | Admitting: *Deleted

## 2014-02-06 NOTE — Telephone Encounter (Signed)
LMOM for pt.  I was just calling to check up on her after her surgery 02-05-14.  Asked that she just return the call to the nursing office if she would just to give an update.  Also advise pt of her p/o appt with Dr. Harlow Asa 02-27-14 @ 11:00.  Thanks!  Anderson Malta

## 2014-02-06 NOTE — Op Note (Signed)
Miranda [name], DOMINSKI                 ACCOUNT NO.:  [id number]  MEDICAL RECORD NO.:  [id number]  LOCATION:                                 FACILITY:  PHYSICIAN:  [name], MD      DATE OF BIRTH:  1965/08/19  DATE OF PROCEDURE:  02/05/2014                               OPERATIVE REPORT   PREOPERATIVE DIAGNOSIS:  Phyllodes tumor, left breast.  POSTOPERATIVE DIAGNOSIS:  Phyllodes tumor, left breast.  PROCEDURE:  Re-excision of left breast biopsy site following excision of phyllodes tumor.  SURGEON:  [name], MD, FACS  ANESTHESIA:  General per Dr. Lorrene [name].  ESTIMATED BLOOD LOSS:  75 mL.  PREPARATION:  ChloraPrep.  COMPLICATIONS:  None.  INDICATIONS:  The patient is a 48 year old female who presented with a mass in the upper outer quadrant in the left breast.  She underwent wire- localized excisional biopsy.  After sending the specimen out for expert opinion, a diagnosis of benign phyllodes tumor was made.  However, the tumor did extend to the inked margin, but did not appear to be transected.  The patient now returns to the operating room for re- excision of the biopsy site with a 1-cm margin.  BODY OF REPORT:  Procedure was done in OR #3 at the [facility].  The patient was brought to the operating room, placed in a supine position on the operating room table.  Following administration of general anesthesia, the patient was positioned and then prepped and draped in the usual aseptic fashion.  After ascertaining that an adequate level of anesthesia had been achieved, the previous surgical incision in the upper outer quadrant left breast was reexcised with a #15 blade.  Dissection was carried into subcutaneous tissues.  Seroma cavity was opened and clear yellow fluid was evacuated from the seroma cavity.  Using the electrocautery, a 1-cm rim of tissue was excised around the previous cavity.  There was a fibrous inflammatory reaction on the wall of  the cavity making it fairly straightforward to define the limits of the previous dissection.  There was an arterial bleeder in the left lateral portion of the breast, which required clamping and ligation with 2-0 silk tie.  Tissue was completely excised and submitted to Pathology for review. Wound was irrigated with warm saline.  Good hemostasis was obtained throughout.  Subcutaneous tissues were closed with interrupted 3-0 Vicryl sutures.  Skin was anesthetized with local anesthetic.  Skin was closed with a running 4-0 Monocryl subcuticular suture.  Wound was washed and dried.  Dermabond was applied.  The patient was awakened from anesthesia and brought to the recovery room.  The patient tolerated the procedure well.   [name], MD, [facility] Office: [phone]    TMG/MEDQ  D:  02/05/2014  T:  02/06/2014  Job:  [id number]

## 2014-02-07 ENCOUNTER — Encounter (HOSPITAL_BASED_OUTPATIENT_CLINIC_OR_DEPARTMENT_OTHER): Payer: Self-pay | Admitting: Surgery

## 2014-02-07 ENCOUNTER — Telehealth (INDEPENDENT_AMBULATORY_CARE_PROVIDER_SITE_OTHER): Payer: Self-pay

## 2014-02-07 NOTE — Telephone Encounter (Signed)
Pt s/p re-excision of left breast. Pt states that she has been hearing fluid in her breast. Informed pt that this is normal, however if she notices that the breast becomes red, hot or tender to touch. Advised pt to contact us as well if she notices any fevers or chills. Pt has a f/u with Dr Harlow Asa on 02/20/14. Pt verbalized understanding.

## 2014-02-12 ENCOUNTER — Telehealth (INDEPENDENT_AMBULATORY_CARE_PROVIDER_SITE_OTHER): Payer: Self-pay

## 2014-02-12 NOTE — Telephone Encounter (Signed)
Pt was calling to see if her path results were back. Informed pt that those results were not in epic at this time. Pt verbalized understanding.

## 2014-02-16 ENCOUNTER — Encounter (HOSPITAL_COMMUNITY): Payer: Self-pay

## 2014-02-16 NOTE — Progress Notes (Signed)
Quick Note:  Benign findings. Another small phyllodes tumor (benign) found in this specimen, negative margins.  Discussed with Dr. Donato Heinz in pathology. Recommend no further surgery - observation only.  LMOM for patient.  [name], MD, [facility] Office: [phone]    ______

## 2014-02-20 ENCOUNTER — Ambulatory Visit (INDEPENDENT_AMBULATORY_CARE_PROVIDER_SITE_OTHER): Payer: Medicaid Other | Admitting: Surgery

## 2014-02-20 ENCOUNTER — Encounter (INDEPENDENT_AMBULATORY_CARE_PROVIDER_SITE_OTHER): Payer: Self-pay | Admitting: Surgery

## 2014-02-20 VITALS — BP 122/76 | HR 80 | Temp 97.6°F | Ht 67.0 in | Wt 218.0 lb

## 2014-02-20 DIAGNOSIS — D249 Benign neoplasm of unspecified breast: Secondary | ICD-10-CM | POA: Insufficient documentation

## 2014-02-20 DIAGNOSIS — N6320 Unspecified lump in the left breast, unspecified quadrant: Secondary | ICD-10-CM

## 2014-02-20 DIAGNOSIS — N63 Unspecified lump in unspecified breast: Secondary | ICD-10-CM

## 2014-02-20 DIAGNOSIS — D242 Benign neoplasm of left breast: Secondary | ICD-10-CM

## 2014-02-20 NOTE — Progress Notes (Signed)
General Surgery [facility]  Chief Complaint  Patient presents with  . Routine Post Op    left breast biopsy - phyllodes tumor    HISTORY: Patient is a 48 year old female who underwent reexcision of the biopsy site which revealed a phyllodes tumor. A 1 cm margin was achieved circumferentially. However an additional 1.3 cm phyllodes tumor was identified in the specimen. The margin was negative but quite close to the mass. Pathology was reviewed at [facility]. No sign of malignancy was identified.  EXAM: Surgical incision in the upper-outer quadrant of the left breast is healing nicely. No sign of infection. Probable moderate seroma. No tenderness.  IMPRESSION: Status post excision of phyllodes tumors (2) from upper outer quadrant left breast  PLAN: Patient and I reviewed the pathology report and I gave her a copy for her records at home. I discussed this with the pathologist. I do not think there is any indication for another excision. Both tumors were benign. Both tumors had a negative surgical margin.  I would like to continue close follow-up. Therefore we will order a repeat mammogram and ultrasound of the left breast in 6 months. Patient will return following that study for physical examination and review of her results.  [name], MD, [facility]   Visit Diagnoses: 1. Phyllodes tumor, benign, left   2. Breast mass, left

## 2014-02-20 NOTE — Patient Instructions (Signed)
  CARE OF INCISION   Apply cocoa butter/vitamin E cream (Palmer's brand) to your incision 2 - 3 times daily.  Massage cream into incision for one minute with each application.  Use sunscreen (50 SPF or higher) for first 6 months after surgery if area is exposed to sun.  You may alternate Mederma or other scar reducing cream with cocoa butter cream if desired.       Esha Fincher M. Mayleen Borrero, MD, FACS      [facility]      Office: [phone]    

## 2014-02-27 ENCOUNTER — Encounter (INDEPENDENT_AMBULATORY_CARE_PROVIDER_SITE_OTHER): Payer: Medicaid Other | Admitting: Surgery

## 2014-03-02 ENCOUNTER — Other Ambulatory Visit (HOSPITAL_COMMUNITY): Payer: Self-pay | Admitting: Endocrinology

## 2014-03-02 ENCOUNTER — Ambulatory Visit (HOSPITAL_COMMUNITY)
Admission: RE | Admit: 2014-03-02 | Discharge: 2014-03-02 | Disposition: A | Discharge status: Home / Self Care | Payer: Medicaid Other | Source: Ambulatory Visit | Attending: Surgery | Admitting: Surgery

## 2014-03-02 DIAGNOSIS — R609 Edema, unspecified: Secondary | ICD-10-CM

## 2014-03-16 ENCOUNTER — Ambulatory Visit (HOSPITAL_COMMUNITY)
Admission: RE | Admit: 2014-03-16 | Discharge: 2014-03-16 | Disposition: A | Discharge status: Home / Self Care | Payer: Medicaid Other | Source: Ambulatory Visit | Attending: Cardiovascular Disease | Admitting: Cardiovascular Disease

## 2014-03-16 ENCOUNTER — Other Ambulatory Visit (HOSPITAL_COMMUNITY): Payer: Self-pay | Admitting: Endocrinology

## 2014-03-16 DIAGNOSIS — R609 Edema, unspecified: Secondary | ICD-10-CM | POA: Diagnosis not present

## 2014-03-16 DIAGNOSIS — D242 Benign neoplasm of left breast: Secondary | ICD-10-CM

## 2014-03-16 NOTE — Progress Notes (Signed)
2D Echocardiogram Complete.  03/16/2014   Jade Burkard, RDCS  

## 2014-03-19 ENCOUNTER — Telehealth (HOSPITAL_COMMUNITY): Payer: Self-pay | Admitting: *Deleted

## 2014-03-28 ENCOUNTER — Institutional Professional Consult (permissible substitution): Payer: Medicaid Other | Admitting: Internal Medicine

## 2014-03-29 ENCOUNTER — Telehealth: Payer: Self-pay | Admitting: Pulmonary Disease

## 2014-03-29 ENCOUNTER — Ambulatory Visit (INDEPENDENT_AMBULATORY_CARE_PROVIDER_SITE_OTHER)
Admission: RE | Admit: 2014-03-29 | Discharge: 2014-03-29 | Disposition: A | Discharge status: Home / Self Care | Payer: Medicaid Other | Source: Ambulatory Visit | Attending: Pulmonary Disease | Admitting: Pulmonary Disease

## 2014-03-29 ENCOUNTER — Encounter: Payer: Self-pay | Admitting: Pulmonary Disease

## 2014-03-29 ENCOUNTER — Ambulatory Visit (INDEPENDENT_AMBULATORY_CARE_PROVIDER_SITE_OTHER): Payer: Medicaid Other | Admitting: Pulmonary Disease

## 2014-03-29 VITALS — BP 122/86 | HR 79 | Ht 67.0 in | Wt 222.2 lb

## 2014-03-29 DIAGNOSIS — R0600 Dyspnea, unspecified: Secondary | ICD-10-CM

## 2014-03-29 DIAGNOSIS — R0609 Other forms of dyspnea: Secondary | ICD-10-CM

## 2014-03-29 DIAGNOSIS — R0989 Other specified symptoms and signs involving the circulatory and respiratory systems: Secondary | ICD-10-CM

## 2014-03-29 DIAGNOSIS — G4733 Obstructive sleep apnea (adult) (pediatric): Secondary | ICD-10-CM

## 2014-03-29 DIAGNOSIS — G4761 Periodic limb movement disorder: Secondary | ICD-10-CM

## 2014-03-29 NOTE — Telephone Encounter (Signed)
Dg Chest 2 View  03/29/2014   CLINICAL DATA:  One month history of shortness of breath with lower extremity swelling. History of recent breast surgery  EXAM: CHEST  2 VIEW  COMPARISON:  .  Portable chest x-ray of July 11, 2009  FINDINGS: The lungs are adequately inflated and clear. The heart and pulmonary vascularity are normal. There is no pleural effusion. The mediastinum is normal in width. The bony thorax is unremarkable. There is radiodense wirelike material projecting over the left axillary region or in the lateral aspect of the left breast.  IMPRESSION: There is no acute cardiopulmonary abnormality.   Electronically Signed   By: David  Martinique   On: 03/29/2014 11:28    Will have my nurse inform pt that CXR is normal.

## 2014-03-29 NOTE — Progress Notes (Signed)
Chief Complaint  Patient presents with  . PULMONARY CONSULT    Referred by Dr Forde Dandy. C/o SOB and swelling since breast surgery June/July 2015.     History of Present Illness: Miranda [name] is a 48 y.o. female for evaluation of dyspnea.  She was found to have a breast tumor in June 2015.  She had excision, but then required re-excision in July.  She had lots of swelling after her surgery, but this improved.  She travelled to [place] in August by plane.  She developed terrible swelling in her Lt leg.  There was concern for DVT, but doppler study was negative.  Her breathing problems have persistent.  She had Echo recently which was unremarkable.  She can sometimes have breathing troubles at rest, but gets this mostly with exertion >> especially walking up stairs.  She will recover after resting for a few minutes.  She denies chest pain or palpitations.  She has not had cough, wheeze, or sputum.    There is no history os asthma, but she did get bronchitis a lot as a child.  She takes zyrtec for allergies.  She has allergies to tomatoes and shellfish as a child >> she can now tolerate tomatoes.  She will also have more allergy trouble in Spring/Fall.  She gets eczema on her neck and arms.  She worked in [employer].  There is not history of pneumonia, or exposure to tuberculosis.  She has second hand smoke exposure from her parents.  She is from [place].    She has not had recent CXR or PFT.  She has trouble with her sleep.  She snores, and has been told she stops breathing.  She had recent sleep study and told she has mild sleep apnea and increase in PLMI.  She was waiting until after her surgeries before starting CPAP.  Tests: PSG 01/04/14 >> AHI 7.1, SpO2 low 81%, PLMI 13.1 Doppler Lt leg 03/02/14 >> no DVT Echo 03/16/14 >> EF 55 to 57%, grade 1 diastolic dysfx  Miranda [name]  has a past medical history of History of idiopathic thrombocytopenic purpura; Hypothyroidism; Family history of  anesthesia complication; ITP (idiopathic thrombocytopenic purpura); Abdominal pain; Vitamin D deficiency; Hyperlipidemia; Endometriosis; Snoring (11/28/2013); and Wears glasses.  Miranda [name]  has past surgical history that includes Cesarean section (2004); Thyroidectomy (2001); Breast lumpectomy (Left, 2009); Exploratory laparotomy; Appendectomy (1995); Laparoscopic assisted vaginal hysterectomy (07/04/2009); Carpal tunnel release (Right, 03/14/2013); Cholecystectomy (N/A, 07/28/2013); Vaginal hysterectomy; Breast biopsy (Left, 01/11/2014); and Re-excision of breast lumpectomy (Left, 02/05/2014).  Prior to Admission medications   Medication Sig Start Date End Date Taking? Authorizing Provider  cetirizine (ZYRTEC) 10 MG tablet Take 10 mg by mouth daily as needed.    Yes Historical Provider, MD  EPINEPHrine (EPIPEN 2-PAK) 0.3 mg/0.3 mL IJ SOAJ injection Inject into the muscle once. Use as directed.   Yes Historical Provider, MD  levothyroxine (SYNTHROID, LEVOTHROID) 150 MCG tablet Take 150 mcg by mouth daily before breakfast. 150 MCG ON EVEN DAYS, 300 MCG ON ODD DAYS   Yes Historical Provider, MD  Multiple Vitamin (MULTIVITAMIN WITH MINERALS) TABS tablet Take 1 tablet by mouth daily.   Yes Historical Provider, MD  Vitamin D, Ergocalciferol, (DRISDOL) 50000 UNITS CAPS capsule Take 50,000 Units by mouth every 7 (seven) days.   Yes Historical Provider, MD    Allergies  Allergen Reactions  . Iodinated Diagnostic Agents Anaphylaxis  . Shellfish Allergy Anaphylaxis  . Demerol [Meperidine] Other (See Comments)    HALLUCINATIONS  .  Benzoin Hives    blister    Her family history includes Allergies in her mother; Anesthesia problems in her mother; Cancer in her father, maternal aunt, paternal aunt, and paternal grandfather; Emphysema in her maternal aunt and maternal uncle; Heart disease in her father.  She  reports that she has never smoked. She has never used smokeless tobacco. She reports that she does not  drink alcohol or use illicit drugs.  Review of Systems  Constitutional: Positive for unexpected weight change. Negative for fever.  HENT: Positive for ear pain. Negative for congestion, dental problem, nosebleeds, postnasal drip, rhinorrhea, sinus pressure, sneezing, sore throat and trouble swallowing.   Eyes: Negative for redness and itching.  Respiratory: Positive for shortness of breath. Negative for cough, chest tightness and wheezing.   Cardiovascular: Negative for palpitations and leg swelling.  Gastrointestinal: Positive for abdominal pain. Negative for nausea and vomiting.       Acid heartburn  Genitourinary: Negative for dysuria.  Musculoskeletal: Positive for arthralgias and joint swelling.  Skin: Negative for rash.  Neurological: Negative for headaches.  Hematological: Does not bruise/bleed easily.  Psychiatric/Behavioral: Negative for dysphoric mood. The patient is not nervous/anxious.    Physical Exam:  General - No distress ENT - No sinus tenderness, no oral exudate, no LAN, no thyromegaly, TM clear, pupils equal/reactive, MP 4, scalloped tongue Cardiac - s1s2 regular, no murmur, pulses symmetric Chest - No wheeze/rales/dullness, good air entry, normal respiratory excursion Back - No focal tenderness Abd - Soft, non-tender, no organomegaly, + bowel sounds Ext - No edema Neuro - Normal strength, cranial nerves intact Skin - eczema on nape of neck Psych - Normal mood, and behavior  Lab Results  Component Value Date   WBC 8.6 07/18/2013   HGB 14.2 02/05/2014   HCT 46.3* 07/18/2013   MCV 89.9 07/18/2013   PLT 390 07/18/2013    Lab Results  Component Value Date   CREATININE 0.91 07/18/2013   BUN 17 07/18/2013   NA 137 07/18/2013   K 4.0 07/18/2013   CL 99 07/18/2013   CO2 27 07/18/2013    Lab Results  Component Value Date   ALT 18 07/11/2009   AST 15 07/11/2009   ALKPHOS 69 07/11/2009   BILITOT 1.0 07/11/2009    Assessment/Plan:  Chesley Mires,  MD  Pulmonary/Critical Care/Sleep Pager:  [phone]

## 2014-03-29 NOTE — Patient Instructions (Signed)
Chest xray today Will schedule breathing test (PFT) Follow up in 2 to 3 weeks

## 2014-03-29 NOTE — Progress Notes (Deleted)
   Subjective:    Patient ID: Miranda [name], female    DOB: 15-Feb-1966, 48 y.o.   MRN: [id number]  HPI    Review of Systems  Constitutional: Positive for unexpected weight change. Negative for fever.  HENT: Positive for ear pain. Negative for congestion, dental problem, nosebleeds, postnasal drip, rhinorrhea, sinus pressure, sneezing, sore throat and trouble swallowing.   Eyes: Negative for redness and itching.  Respiratory: Positive for shortness of breath. Negative for cough, chest tightness and wheezing.   Cardiovascular: Negative for palpitations and leg swelling.  Gastrointestinal: Positive for abdominal pain. Negative for nausea and vomiting.       Acid heartburn  Genitourinary: Negative for dysuria.  Musculoskeletal: Positive for arthralgias and joint swelling.  Skin: Negative for rash.  Neurological: Negative for headaches.  Hematological: Does not bruise/bleed easily.  Psychiatric/Behavioral: Negative for dysphoric mood. The patient is not nervous/anxious.        Objective:   Physical Exam        Assessment & Plan:

## 2014-03-30 NOTE — Telephone Encounter (Signed)
Results have been explained to patient, pt expressed understanding. Nothing further needed.  

## 2014-04-03 ENCOUNTER — Encounter: Payer: Self-pay | Admitting: Pulmonary Disease

## 2014-04-03 DIAGNOSIS — R0600 Dyspnea, unspecified: Secondary | ICD-10-CM | POA: Insufficient documentation

## 2014-04-03 DIAGNOSIS — G4761 Periodic limb movement disorder: Secondary | ICD-10-CM | POA: Insufficient documentation

## 2014-04-03 NOTE — Assessment & Plan Note (Signed)
She was noted to have borderline elevation of PLMI during recent sleep study.  She does not have symptoms suggestive of restless leg syndrome.  I explained how PLMI elevation can be associated with other sleep disorders, such as sleep apnea, and can also be an incidental finding.

## 2014-04-03 NOTE — Assessment & Plan Note (Signed)
She has persistent dyspnea for the past several months.  It is difficult to say what this is from.  She had recent Echo which was unrevealing except for mild diastolic dysfunction, and clinical suspicion that dyspnea is her angina equivalent is low.  Defer to her PCP about whether additional cardiology evaluation is needed.  She has hx of allergies, and second hand tobacco exposure.  Her symptoms however are not typical of obstructive airway disease, but still possible.  To further assess will arrange for chest xray and PFT's.  Defer inhaler therapy for now.  She had left leg swelling recently, but doppler leg was negative.  Suspicion for chronic thrombo-embolic disease is low, especially with relatively unremarkable Echo regarding Rt heart pressures.  Certainly she could have a component of deconditioning.  She might need cardiopulmonary stress testing to further determine this.  She would also likely benefit from monitored exercise program if the remainder of her evaluation is unrevealing.

## 2014-04-03 NOTE — Assessment & Plan Note (Signed)
I had a brief discussion about her sleep study results, and impact sleep apnea can have on her health long term.  Also explained how some of her nocturnal symptoms of dyspnea could be related to untreated sleep apnea.    Advised her to d/w her PCP about additional options to treat sleep apnea.

## 2014-04-12 ENCOUNTER — Ambulatory Visit (INDEPENDENT_AMBULATORY_CARE_PROVIDER_SITE_OTHER): Payer: Medicaid Other | Admitting: Pulmonary Disease

## 2014-04-12 DIAGNOSIS — R0600 Dyspnea, unspecified: Secondary | ICD-10-CM

## 2014-04-12 DIAGNOSIS — R0989 Other specified symptoms and signs involving the circulatory and respiratory systems: Secondary | ICD-10-CM

## 2014-04-12 DIAGNOSIS — R0609 Other forms of dyspnea: Secondary | ICD-10-CM

## 2014-04-12 NOTE — Progress Notes (Signed)
PFT done today. 

## 2014-04-13 ENCOUNTER — Ambulatory Visit (INDEPENDENT_AMBULATORY_CARE_PROVIDER_SITE_OTHER): Payer: Medicaid Other | Admitting: Pulmonary Disease

## 2014-04-13 ENCOUNTER — Encounter: Payer: Self-pay | Admitting: Pulmonary Disease

## 2014-04-13 VITALS — BP 120/80 | HR 82 | Temp 98.4°F | Ht 67.0 in | Wt 219.0 lb

## 2014-04-13 DIAGNOSIS — R0609 Other forms of dyspnea: Secondary | ICD-10-CM

## 2014-04-13 DIAGNOSIS — R0600 Dyspnea, unspecified: Secondary | ICD-10-CM

## 2014-04-13 DIAGNOSIS — G4761 Periodic limb movement disorder: Secondary | ICD-10-CM

## 2014-04-13 DIAGNOSIS — G4733 Obstructive sleep apnea (adult) (pediatric): Secondary | ICD-10-CM

## 2014-04-13 DIAGNOSIS — R0989 Other specified symptoms and signs involving the circulatory and respiratory systems: Secondary | ICD-10-CM

## 2014-04-13 LAB — PULMONARY FUNCTION TEST
DL/VA % pred: 96 %
DL/VA: 4.89 ml/min/mmHg/L
DLCO unc % pred: 94 %
DLCO unc: 25.47 ml/min/mmHg
FEF 25-75 Post: 2.77 L/sec
FEF 25-75 Pre: 2.58 L/sec
FEF2575-%Change-Post: 7 %
FEF2575-%Pred-Post: 92 %
FEF2575-%Pred-Pre: 86 %
FEV1-%Change-Post: 1 %
FEV1-%Pred-Post: 99 %
FEV1-%Pred-Pre: 97 %
FEV1-Post: 3.03 L
FEV1-Pre: 2.98 L
FEV1FVC-%Change-Post: 2 %
FEV1FVC-%Pred-Pre: 95 %
FEV6-%Change-Post: 0 %
FEV6-%Pred-Post: 101 %
FEV6-%Pred-Pre: 102 %
FEV6-Post: 3.82 L
FEV6-Pre: 3.84 L
FEV6FVC-%Change-Post: 0 %
FEV6FVC-%Pred-Post: 102 %
FEV6FVC-%Pred-Pre: 101 %
FVC-%Change-Post: 0 %
FVC-%Pred-Post: 99 %
FVC-%Pred-Pre: 100 %
FVC-Post: 3.82 L
Post FEV1/FVC ratio: 79 %
Post FEV6/FVC ratio: 100 %
Pre FEV1/FVC ratio: 78 %
Pre FEV6/FVC Ratio: 100 %
RV % pred: 84 %
RV: 1.55 L
TLC % pred: 98 %
TLC: 5.29 L

## 2014-04-13 NOTE — Patient Instructions (Signed)
Will arrange for referral to Dr. Oneal Grout to assess for oral appliance to treat obstructive sleep apnea Follow up in 3 months

## 2014-04-13 NOTE — Assessment & Plan Note (Signed)
Will treat her sleep apnea first, and then re-assess whether she needs evaluation for RLS.

## 2014-04-13 NOTE — Assessment & Plan Note (Signed)
Her evaluation has been unrevealing.  Likely related to deconditioning after her surgeries.  Will defer further evaluation until after she is established on therapy for her sleep apnea.

## 2014-04-13 NOTE — Progress Notes (Signed)
Chief Complaint  Patient presents with  . Follow-up    Review PFT. Reports no change in DOE since last OV.     History of Present Illness: Miranda [name] is a 48 y.o. female with 2nd hand tobacco exposure, dyspnea.  She also has hx of OSA, PLMS.  CXR from 03/29/14 was normal.  PFT from 04/12/14 showed was normal.  She would like me to assume care of her sleep apnea.  She would prefer an oral appliance.  She gets leg symptoms sporadically.   TESTS: PSG 01/04/14 >> AHI 7.1, SpO2 low 81%, PLMI 13.1  Doppler Lt leg 03/02/14 >> no DVT  Echo 03/16/14 >> EF 55 to 02%, grade 1 diastolic dysfx PFT 5/42/70 >> FEV1 3.03 (99%), FEV1% 79, TLC 5.29 (98%), DLCO 94%, no BD  Past medical hx, Past surgical hx, Medications, Allergies, Family hx, Social hx all reviewed.   Physical Exam:  General - No distress ENT - No sinus tenderness, no oral exudate, no LAN, MP 4, scalloped tongue Cardiac - s1s2 regular, no murmur Chest - No wheeze/rales/dullness Back - No focal tenderness Abd - Soft, non-tender Ext - No edema Neuro - Normal strength Skin - No rashes Psych - normal mood, and behavior   Assessment/Plan:  Chesley Mires, MD [place] Pulmonary/Critical Care/Sleep Pager:  [phone]

## 2014-04-13 NOTE — Assessment & Plan Note (Signed)
She is reluctant to use CPAP.  Will refer her to Dr. Oneal Grout to assess for oral appliance as alternate therapy for OSA.

## 2014-08-17 IMAGING — US US BREAST LTD UNI LEFT INC AXILLA
1 series · 6 of 6 positions shown · non-contrast
Comparison: Mammography 11/21/2013, 08/27/2011, dating back to
04/30/2009.

ADDENDUM:
I spoke with the patient on December 14, 2013 at 12 p.m. to discuss
pathology results from recent ultrasound-guided core needle left
breast biopsy.

Pathology results demonstrated biphasic lesion, favor fibroadenoma.
Cannot exclude phyllodes.
Surgical excision is recommended.
Patient states no complaints at the biopsy site.
Patient was scheduled with Dr. Adisthia at 8682 am on 12/28/2013
CLINICAL DATA: Recall from screening mammography, left breast mass.
Biopsy proved left breast fibroadenoma in 3353.
EXAM:
DIGITAL DIAGNOSTIC  LEFT MAMMOGRAM
ULTRASOUND LEFT BREAST

[Series 1: superficial breast · 6 of 6 slices shown]
[im 1/6]
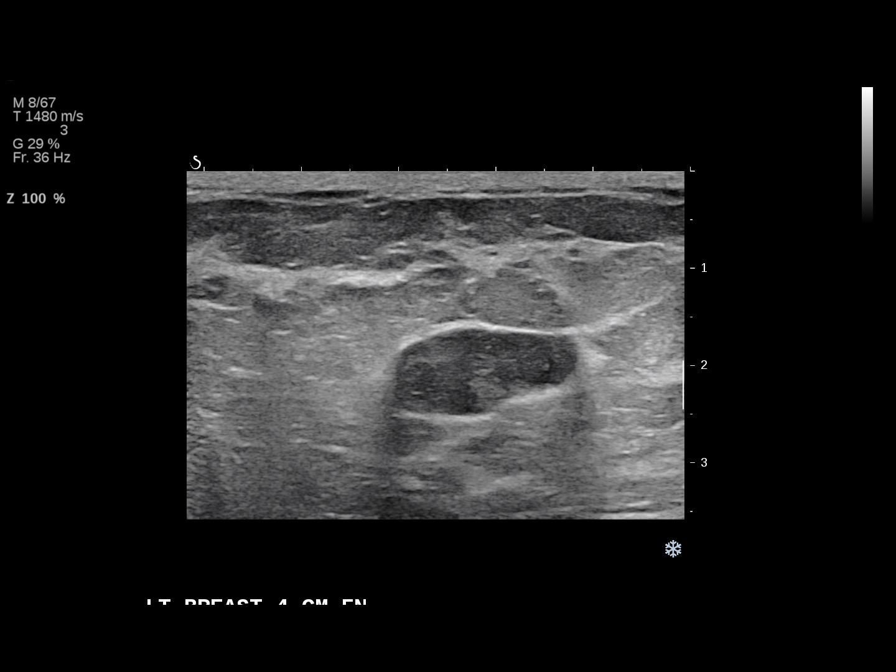
[im 2/6]
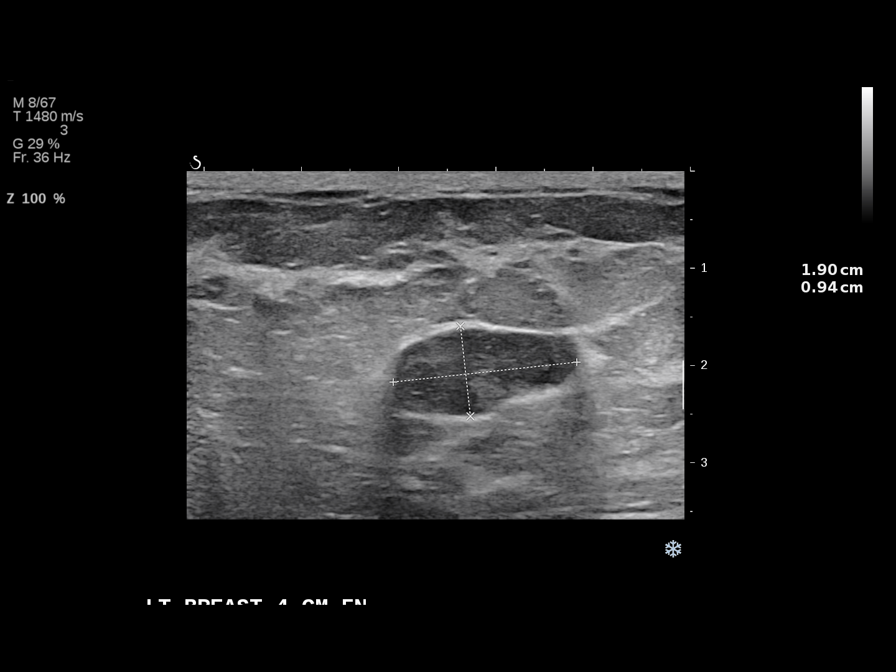
[im 3/6]
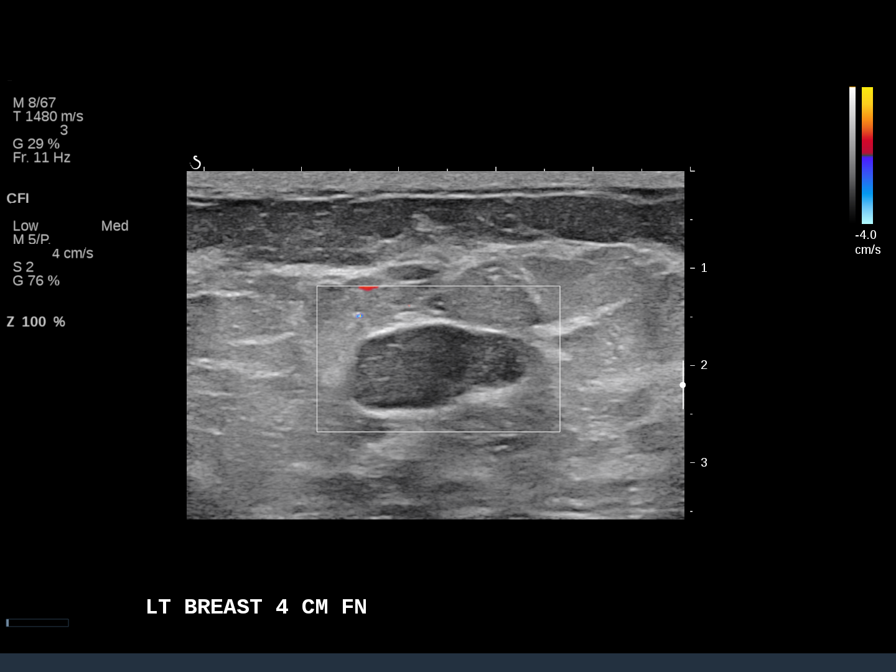
[im 4/6]
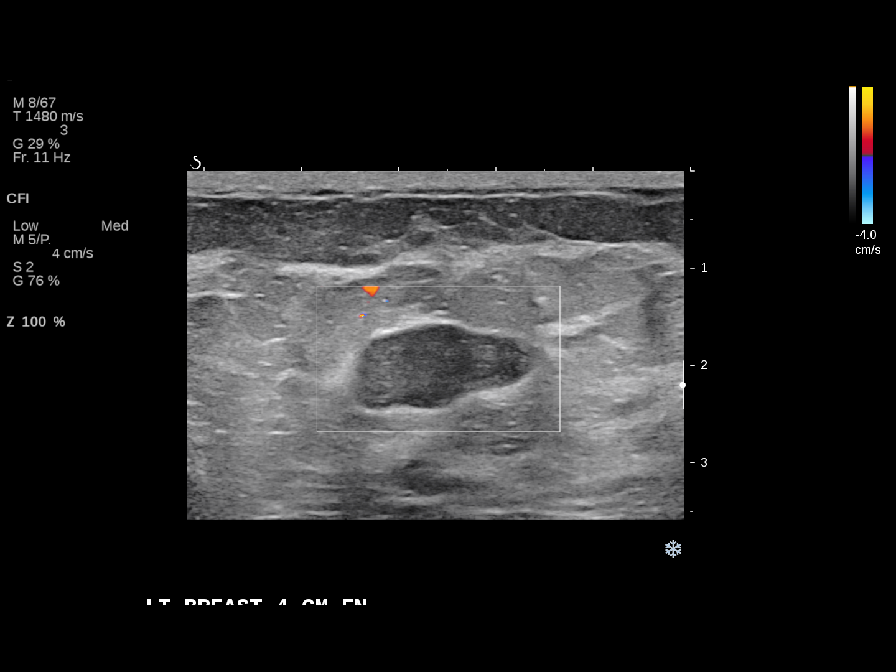
[im 5/6]
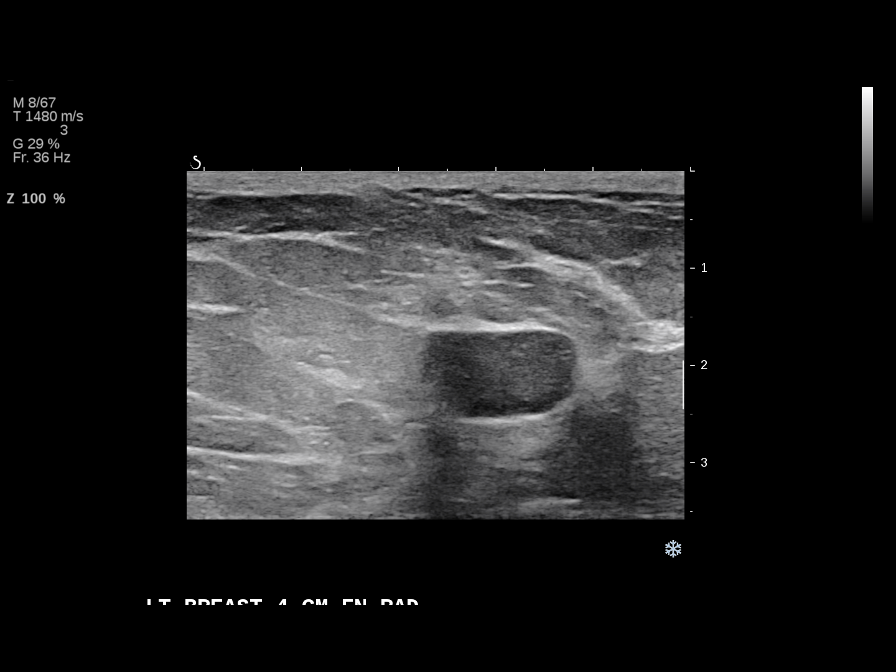
[im 6/6]
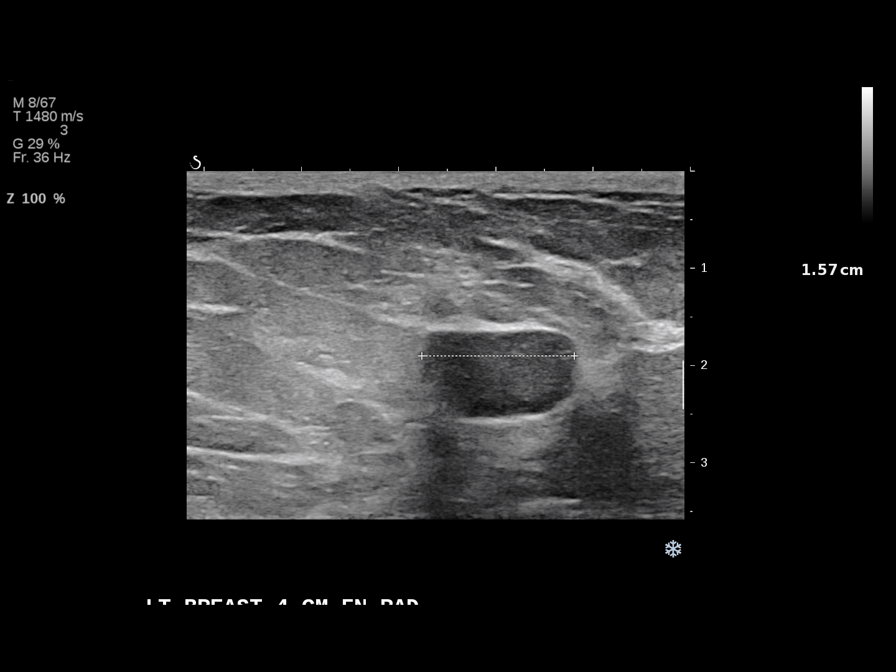

[6 of 6 positions shown; findings below may reference images not displayed]

ACR Breast Density Category b: There are scattered areas of
fibroglandular density.
FINDINGS: Spot compression CC and MLO views of the area of concern in the
upper left breast, middle [DATE], were obtained. These confirm a
circumscribed partially obscured mass without associated
architectural distortion or suspicious calcification. The
biopsy-proven fibroadenoma in the upper outer left breast with an
associated ribbon tissue marker clip is again noted.

On physical exam, I am not able to palpate the mass in the upper
left breast, even after ultrasound localization.

Ultrasound is performed, showing an oval-shaped circumscribed
horizontally oriented hypoechoic mass with acoustic enhancement at
the 12 o'clock position of the left breast approximately 4 cm from
the nipple measuring approximately 1.6 x 0.9 x 1.9 cm. There is no
visible internal color Doppler flow.
IMPRESSION: Likely benign fibroadenoma in the upper left breast accounting for
the area of concern on screening mammography. However, since this
mass is new since 8208, phyllodes tumor is in the differential
diagnosis.

RECOMMENDATION:
Ultrasound-guided core needle biopsy of the the new mass in the
upper left breast.

The biopsy procedure was discussed with the patient. All of her
questions were answered. She has agreed to proceed and this has been
scheduled for [REDACTED], [DATE], at 8 o'clock a.m.

I have discussed the findings and recommendations with the patient.
Results were also provided in writing at the conclusion of the
visit.

BI-RADS CATEGORY  4: Suspicious.

## 2014-09-23 IMAGING — MG MM LT PLC BREAST LOC DEV   1ST LESION  INC MAMMO GUIDE
1 series · 1 of 1 positions shown · non-contrast
Comparison: Previous exams.

CLINICAL DATA: 1.9 cm left breast mass at 12 o'clock position, for
which excisional biopsy is recommended.

EXAM:
NEEDLE LOCALIZATION OF THE LEFT BREAST WITH ULTRASOUND GUIDANCE

[L CC]
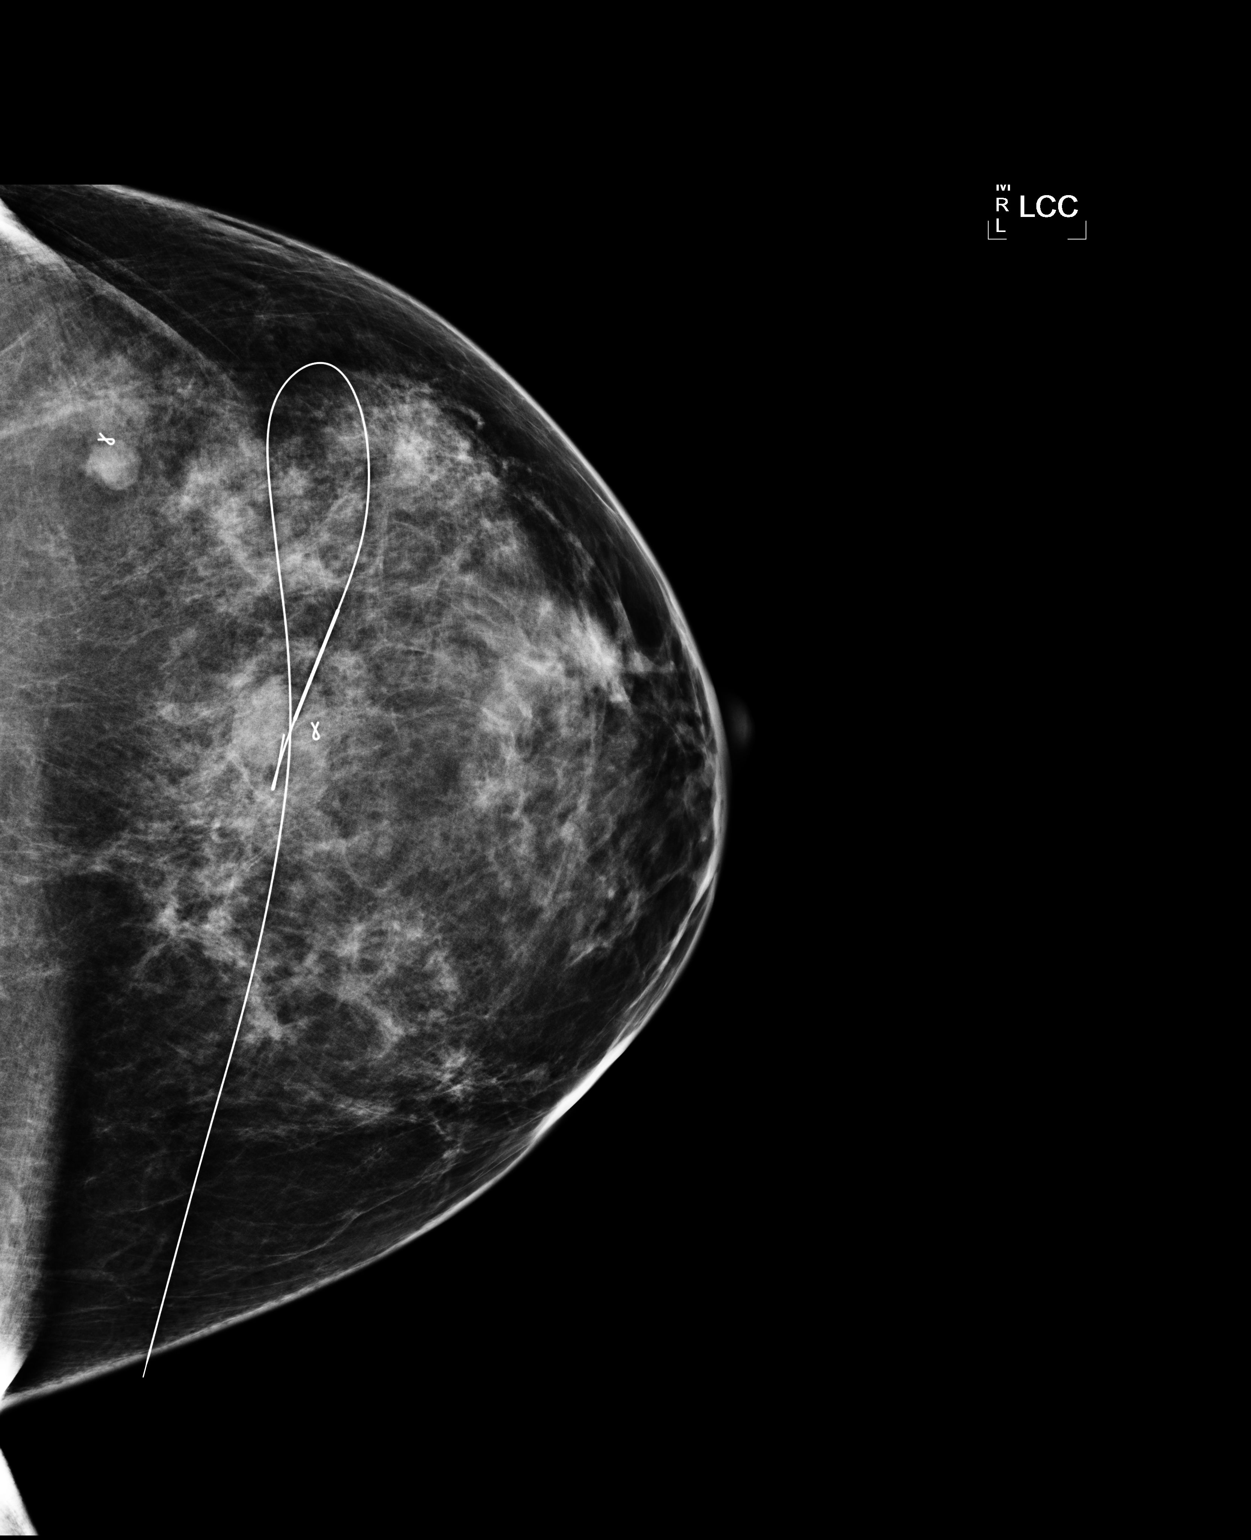

[1 of 1 positions shown; findings below may reference images not displayed]

FINDINGS: Patient presents for needle localization prior to excisional biopsy.
I met with the patient and we discussed the procedure of needle
localization including benefits and alternatives. We discussed the
high likelihood of a successful procedure. We discussed the risks of
the procedure, including infection, bleeding, tissue injury, and
further surgery. Informed, written consent was given. The usual
time-out protocol was performed immediately prior to the procedure.

Using ultrasound guidance, sterile technique, 2% lidocaine and a 9
cm modified Kopans needle, a 1.9 cm oval mass 12 o'clock position
left breast was localized using a lateral to medial approach. The
films were marked for Dr. Erxleben.
IMPRESSION: Needle localization left breast. No apparent complications.

## 2014-11-28 ENCOUNTER — Other Ambulatory Visit: Payer: Self-pay | Admitting: Obstetrics and Gynecology

## 2014-11-29 LAB — CYTOLOGY - PAP

## 2015-01-10 ENCOUNTER — Other Ambulatory Visit: Payer: Self-pay | Admitting: Surgery

## 2015-01-10 ENCOUNTER — Other Ambulatory Visit: Payer: Self-pay | Admitting: Radiology

## 2015-12-25 ENCOUNTER — Other Ambulatory Visit: Payer: Self-pay | Admitting: Radiology

## 2016-12-02 ENCOUNTER — Telehealth: Payer: Self-pay | Admitting: *Deleted

## 2016-12-02 NOTE — Telephone Encounter (Signed)
NOTES SENT TO SCHEDULING.  °

## 2016-12-31 ENCOUNTER — Encounter: Payer: Self-pay | Admitting: Cardiovascular Disease

## 2016-12-31 ENCOUNTER — Ambulatory Visit (INDEPENDENT_AMBULATORY_CARE_PROVIDER_SITE_OTHER): Payer: Managed Care, Other (non HMO) | Admitting: Cardiovascular Disease

## 2016-12-31 VITALS — BP 118/82 | HR 73 | Ht 67.0 in | Wt 214.0 lb

## 2016-12-31 DIAGNOSIS — R002 Palpitations: Secondary | ICD-10-CM | POA: Diagnosis not present

## 2016-12-31 NOTE — Progress Notes (Signed)
Cardiology Office Note Date:  01/01/2017   ID:  Miranda [name], DOB 1965/11/30, MRN [id number]  PCP:  Miranda Bowen, MD  Cardiologist:  Miranda Mocha, MD    Chief Complaint  Patient presents with  . Palpitations     History of Present Illness: Miranda [name] is a 51 y.o. female who presents for evaluation of palpitations. She describes episodes of heart pounding with heart rate > 120 bpm. Symptoms are associated with eating, generally occurring within 2 hours of a meal. Other symptoms include diarrhea, bloating, chest pain/fullness. The typical order of symptoms is diarrhea then body shaking then chest symptoms of palpitations/chest discomfort. Also states her face turns red and flushed. She says it feels like an 'allergic reaction.' She denies syncope or presyncope.   No other cardiac symptoms. She's active with 50 yo twins but not engaged in regular exercise. Also complains of ankle swelling. No orthopnea or PND.    Past Medical History:  Diagnosis Date  . Breast tumor July 2015  . Endometriosis   . Family history of anesthesia complication    pt's mother has hx. of being hard to wake up post-op  . History of idiopathic thrombocytopenic purpura    age 43 - no problems since  . Hyperlipidemia   . Hypothyroidism   . OSA (obstructive sleep apnea) 11/28/2013  . Vitamin D deficiency   . Wears glasses     Past Surgical History:  Procedure Laterality Date  . APPENDECTOMY  1995   open appy.  Miranda [name] BREAST BIOPSY Left 01/11/2014   Procedure: BREAST BIOPSY WITH NEEDLE LOCALIZATION;  Surgeon: Miranda Regal, MD;  Location: [facility];  Service: General;  Laterality: Left;  . BREAST LUMPECTOMY Left 2009  . CARPAL TUNNEL RELEASE Right 03/14/2013   Procedure: EXPLORE CANAL RIGHT CARPAL;  Surgeon: Miranda [name]., MD;  Location: [facility];  Service: Orthopedics;  Laterality: Right;  . CESAREAN SECTION  2004   twins  . CHOLECYSTECTOMY N/A 07/28/2013   Procedure:  LAPAROSCOPIC CHOLECYSTECTOMY ;  Surgeon: Miranda Regal, MD;  Location: [facility];  Service: General;  Laterality: N/A;  . EXPLORATORY LAPAROTOMY    . LAPAROSCOPIC ASSISTED VAGINAL HYSTERECTOMY  07/04/2009  . RE-EXCISION OF BREAST LUMPECTOMY Left 02/05/2014   Procedure: RE-EXCISION OF LEFT BREAST MASS;  Surgeon: Miranda Regal, MD;  Location: [facility];  Service: General;  Laterality: Left;  . THYROIDECTOMY  2001  . VAGINAL HYSTERECTOMY      Current Outpatient Prescriptions  Medication Sig Dispense Refill  . cetirizine (ZYRTEC) 10 MG tablet Take 10 mg by mouth daily as needed.     Miranda [name] EPINEPHrine (EPIPEN 2-PAK) 0.3 mg/0.3 mL IJ SOAJ injection Inject into the muscle once. Use as directed.    Miranda [name] levothyroxine (SYNTHROID, LEVOTHROID) 150 MCG tablet Take 175 mcg by mouth 2 (two) times daily.     . Multiple Vitamin (MULTIVITAMIN WITH MINERALS) TABS tablet Take 1 tablet by mouth daily.    . Vitamin D, Ergocalciferol, (DRISDOL) 50000 UNITS CAPS capsule Take 50,000 Units by mouth every 7 (seven) days.     No current facility-administered medications for this visit.     Allergies:   Iodinated diagnostic agents; Iodine; Other; Shellfish allergy; Demerol [meperidine]; and Benzoin   Social History:  The patient  reports that she has never smoked. She has never used smokeless tobacco. She reports that she does not drink alcohol or use drugs.   Family History:  The patient's family  history includes Allergies in her mother; Anesthesia problems in her mother; Cancer in her father, maternal aunt, paternal aunt, and paternal grandfather; Emphysema in her maternal aunt and maternal uncle; Heart disease in her father.    ROS:  Please see the history of present illness.  Otherwise, review of systems is positive for Weight gain, leg swelling, diarrhea, back pain, dizziness, nausea, headaches.  All other systems are reviewed and negative.    PHYSICAL EXAM: VS:  BP 118/82   Pulse 73   Ht 5\' 7"  (1.702  m)   Wt 214 lb (97.1 kg)   BMI 33.52 kg/m  , BMI Body mass index is 33.52 kg/m. GEN: Well nourished, well developed, overweight woman in no acute distress  HEENT: normal  Neck: no JVD, no masses. No carotid bruits Cardiac: RRR without murmur or gallop                Respiratory:  clear to auscultation bilaterally, normal work of breathing GI: soft, nontender, nondistended, + BS MS: no deformity or atrophy  Ext: no pretibial edema, pedal pulses 2+= bilaterally Skin: warm and dry, no rash Neuro:  Strength and sensation are intact Psych: euthymic mood, full affect  EKG:  EKG is ordered today. The ekg ordered today shows normal sinus rhythm 73 bpm, within normal limits.  Recent Labs: No results found for requested labs within last 8760 hours.   Lipid Panel  No results found for: CHOL, TRIG, HDL, CHOLHDL, VLDL, LDLCALC, LDLDIRECT    Wt Readings from Last 3 Encounters:  12/31/16 214 lb (97.1 kg)  04/13/14 219 lb (99.3 kg)  03/29/14 222 lb 3.2 oz (100.8 kg)     Cardiac Studies Reviewed: Echo 03-16-2014: Study Conclusions  - Left ventricle: The cavity size was normal. Wall thickness was normal. Systolic function was normal. The estimated ejection fraction was in the range of 55% to 60%. Wall motion was normal; there were no regional wall motion abnormalities. Doppler parameters are consistent with abnormal left ventricular relaxation (grade 1 diastolic dysfunction).  Impressions:  - Normal LV function; grade 1 diastolic dysfunction; trace MR and TR.  ASSESSMENT AND PLAN: This is a 16 woman with heart palpitations. Had her palpitations is somewhat unusual in that it is temporally related to eating and the rapid heartbeat seems to be part of a systemic reaction where the patient describes flushing, diarrhea, and tightness in the chest. She feels like the chest discomfort comes from abdominal pressure and bloating. She has not had any of these symptoms with physical  exertion. Her EKG is normal. Previous echocardiogram is reviewed demonstrating normal findings. Lab evaluation has been performed by Dr. Forde Miranda [name] to evaluate for carcinoid.  Will check an event monitor to try to capture the patient's heart rhythm irregularities episodes. I suspicion is that around heart rate is secondary to a systemic reaction rather a primary cardiac abnormality. However, it seems reasonable to try to document an arrhythmia during one of her episodes. Will follow-up pending these results.   Current medicines are reviewed with the patient today.  The patient does not have concerns regarding medicines.  Labs/ tests ordered today include:   Orders Placed This Encounter  Procedures  . Cardiac event monitor  . EKG 12-Lead    Disposition:   FU prn  Signed, Miranda Mocha, MD  01/01/2017 5:29 PM    [facility] [address], [place], [place]  [address] Phone: [phone]; Fax: [phone]

## 2016-12-31 NOTE — Patient Instructions (Signed)
Medication Instructions:  Your physician recommends that you continue on your current medications as directed. Please refer to the Current Medication list given to you today.  Labwork: No new orders.   Testing/Procedures: Your physician has recommended that you wear an event monitor. Event monitors are medical devices that record the heart's electrical activity. Doctors most often [place] these monitors to diagnose arrhythmias. Arrhythmias are problems with the speed or rhythm of the heartbeat. The monitor is a small, portable device. You can wear one while you do your normal daily activities. This is usually used to diagnose what is causing palpitations/syncope (passing out).  Follow-Up: Your physician recommends that you schedule a follow-up appointment as needed with Dr Burt Knack.   Any Other Special Instructions Will Be Listed Below (If Applicable).     If you need a refill on your cardiac medications before your next appointment, please call your pharmacy.

## 2017-01-18 ENCOUNTER — Ambulatory Visit (INDEPENDENT_AMBULATORY_CARE_PROVIDER_SITE_OTHER): Payer: Managed Care, Other (non HMO)

## 2017-01-18 DIAGNOSIS — R002 Palpitations: Secondary | ICD-10-CM

## 2017-03-03 ENCOUNTER — Telehealth: Payer: Self-pay | Admitting: Cardiovascular Disease

## 2017-03-03 NOTE — Telephone Encounter (Signed)
-----   Message from [name], MD sent at 02/28/2017  1:27 PM EDT ----- Reviewed. No significant arrhythmia

## 2017-03-03 NOTE — Telephone Encounter (Signed)
New message    Pt is returning call to Rn about results.

## 2017-03-03 NOTE — Telephone Encounter (Signed)
Patient informed. 

## 2017-12-21 ENCOUNTER — Encounter: Payer: Self-pay | Admitting: Internal Medicine

## 2018-01-26 ENCOUNTER — Encounter: Payer: Self-pay | Admitting: *Deleted

## 2018-02-21 ENCOUNTER — Encounter

## 2018-02-21 ENCOUNTER — Ambulatory Visit: Payer: Managed Care, Other (non HMO) | Admitting: Internal Medicine

## 2022-10-26 DIAGNOSIS — D242 Benign neoplasm of left breast: Secondary | ICD-10-CM | POA: Diagnosis not present

## 2022-10-26 DIAGNOSIS — N644 Mastodynia: Secondary | ICD-10-CM | POA: Diagnosis not present

## 2022-11-17 DIAGNOSIS — D242 Benign neoplasm of left breast: Secondary | ICD-10-CM | POA: Diagnosis not present

## 2022-11-17 DIAGNOSIS — Z803 Family history of malignant neoplasm of breast: Secondary | ICD-10-CM | POA: Diagnosis not present

## 2022-11-17 DIAGNOSIS — Z1239 Encounter for other screening for malignant neoplasm of breast: Secondary | ICD-10-CM | POA: Diagnosis not present

## 2022-12-17 DIAGNOSIS — D242 Benign neoplasm of left breast: Secondary | ICD-10-CM | POA: Diagnosis not present

## 2022-12-17 DIAGNOSIS — N6489 Other specified disorders of breast: Secondary | ICD-10-CM | POA: Diagnosis not present

## 2022-12-17 DIAGNOSIS — N6320 Unspecified lump in the left breast, unspecified quadrant: Secondary | ICD-10-CM | POA: Diagnosis not present

## 2022-12-17 DIAGNOSIS — N6325 Unspecified lump in the left breast, overlapping quadrants: Secondary | ICD-10-CM | POA: Diagnosis not present

## 2022-12-18 DIAGNOSIS — Z881 Allergy status to other antibiotic agents status: Secondary | ICD-10-CM | POA: Diagnosis not present

## 2022-12-18 DIAGNOSIS — D242 Benign neoplasm of left breast: Secondary | ICD-10-CM | POA: Diagnosis not present

## 2022-12-18 DIAGNOSIS — E669 Obesity, unspecified: Secondary | ICD-10-CM | POA: Diagnosis not present

## 2022-12-18 DIAGNOSIS — Z6835 Body mass index (BMI) 35.0-35.9, adult: Secondary | ICD-10-CM | POA: Diagnosis not present

## 2022-12-18 DIAGNOSIS — Z91041 Radiographic dye allergy status: Secondary | ICD-10-CM | POA: Diagnosis not present

## 2022-12-18 DIAGNOSIS — G4733 Obstructive sleep apnea (adult) (pediatric): Secondary | ICD-10-CM | POA: Diagnosis not present

## 2022-12-18 DIAGNOSIS — Z91040 Latex allergy status: Secondary | ICD-10-CM | POA: Diagnosis not present

## 2022-12-18 DIAGNOSIS — D4860 Neoplasm of uncertain behavior of unspecified breast: Secondary | ICD-10-CM | POA: Diagnosis not present

## 2022-12-18 DIAGNOSIS — Z885 Allergy status to narcotic agent status: Secondary | ICD-10-CM | POA: Diagnosis not present

## 2022-12-29 DIAGNOSIS — Z803 Family history of malignant neoplasm of breast: Secondary | ICD-10-CM | POA: Diagnosis not present

## 2022-12-29 DIAGNOSIS — D242 Benign neoplasm of left breast: Secondary | ICD-10-CM | POA: Diagnosis not present

## 2022-12-29 DIAGNOSIS — Z1239 Encounter for other screening for malignant neoplasm of breast: Secondary | ICD-10-CM | POA: Diagnosis not present

## 2023-01-05 DIAGNOSIS — H00015 Hordeolum externum left lower eyelid: Secondary | ICD-10-CM | POA: Diagnosis not present

## 2023-01-05 DIAGNOSIS — H0100B Unspecified blepharitis left eye, upper and lower eyelids: Secondary | ICD-10-CM | POA: Diagnosis not present

## 2023-01-05 DIAGNOSIS — H00014 Hordeolum externum left upper eyelid: Secondary | ICD-10-CM | POA: Diagnosis not present

## 2023-01-14 DIAGNOSIS — Z01419 Encounter for gynecological examination (general) (routine) without abnormal findings: Secondary | ICD-10-CM | POA: Diagnosis not present

## 2023-01-14 DIAGNOSIS — Z6837 Body mass index (BMI) 37.0-37.9, adult: Secondary | ICD-10-CM | POA: Diagnosis not present

## 2023-01-14 DIAGNOSIS — N952 Postmenopausal atrophic vaginitis: Secondary | ICD-10-CM | POA: Diagnosis not present

## 2023-01-19 DIAGNOSIS — Z1331 Encounter for screening for depression: Secondary | ICD-10-CM | POA: Diagnosis not present

## 2023-01-19 DIAGNOSIS — E785 Hyperlipidemia, unspecified: Secondary | ICD-10-CM | POA: Diagnosis not present

## 2023-01-19 DIAGNOSIS — K760 Fatty (change of) liver, not elsewhere classified: Secondary | ICD-10-CM | POA: Diagnosis not present

## 2023-01-19 DIAGNOSIS — E559 Vitamin D deficiency, unspecified: Secondary | ICD-10-CM | POA: Diagnosis not present

## 2023-01-19 DIAGNOSIS — D4860 Neoplasm of uncertain behavior of unspecified breast: Secondary | ICD-10-CM | POA: Diagnosis not present

## 2023-01-19 DIAGNOSIS — E119 Type 2 diabetes mellitus without complications: Secondary | ICD-10-CM | POA: Diagnosis not present

## 2023-01-19 DIAGNOSIS — E890 Postprocedural hypothyroidism: Secondary | ICD-10-CM | POA: Diagnosis not present

## 2023-01-19 DIAGNOSIS — F418 Other specified anxiety disorders: Secondary | ICD-10-CM | POA: Diagnosis not present

## 2023-01-19 DIAGNOSIS — I5189 Other ill-defined heart diseases: Secondary | ICD-10-CM | POA: Diagnosis not present

## 2023-01-19 DIAGNOSIS — E669 Obesity, unspecified: Secondary | ICD-10-CM | POA: Diagnosis not present

## 2023-01-19 DIAGNOSIS — K589 Irritable bowel syndrome without diarrhea: Secondary | ICD-10-CM | POA: Diagnosis not present

## 2023-02-26 DIAGNOSIS — H0100B Unspecified blepharitis left eye, upper and lower eyelids: Secondary | ICD-10-CM | POA: Diagnosis not present

## 2023-02-26 DIAGNOSIS — H40053 Ocular hypertension, bilateral: Secondary | ICD-10-CM | POA: Diagnosis not present

## 2023-02-26 DIAGNOSIS — H00011 Hordeolum externum right upper eyelid: Secondary | ICD-10-CM | POA: Diagnosis not present

## 2023-02-26 DIAGNOSIS — H0100A Unspecified blepharitis right eye, upper and lower eyelids: Secondary | ICD-10-CM | POA: Diagnosis not present

## 2023-04-02 DIAGNOSIS — H40053 Ocular hypertension, bilateral: Secondary | ICD-10-CM | POA: Diagnosis not present

## 2023-04-02 DIAGNOSIS — H01005 Unspecified blepharitis left lower eyelid: Secondary | ICD-10-CM | POA: Diagnosis not present

## 2023-04-02 DIAGNOSIS — H01002 Unspecified blepharitis right lower eyelid: Secondary | ICD-10-CM | POA: Diagnosis not present

## 2023-07-02 DIAGNOSIS — N6320 Unspecified lump in the left breast, unspecified quadrant: Secondary | ICD-10-CM | POA: Diagnosis not present

## 2023-07-02 DIAGNOSIS — N6324 Unspecified lump in the left breast, lower inner quadrant: Secondary | ICD-10-CM | POA: Diagnosis not present

## 2023-07-02 DIAGNOSIS — Z23 Encounter for immunization: Secondary | ICD-10-CM | POA: Diagnosis not present

## 2023-07-02 DIAGNOSIS — R928 Other abnormal and inconclusive findings on diagnostic imaging of breast: Secondary | ICD-10-CM | POA: Diagnosis not present

## 2023-07-02 DIAGNOSIS — N6323 Unspecified lump in the left breast, lower outer quadrant: Secondary | ICD-10-CM | POA: Diagnosis not present

## 2023-07-22 DIAGNOSIS — Z1239 Encounter for other screening for malignant neoplasm of breast: Secondary | ICD-10-CM | POA: Diagnosis not present

## 2023-07-22 DIAGNOSIS — D242 Benign neoplasm of left breast: Secondary | ICD-10-CM | POA: Diagnosis not present

## 2023-07-22 DIAGNOSIS — Z803 Family history of malignant neoplasm of breast: Secondary | ICD-10-CM | POA: Diagnosis not present

## 2023-07-23 DIAGNOSIS — H40053 Ocular hypertension, bilateral: Secondary | ICD-10-CM | POA: Diagnosis not present

## 2023-07-23 DIAGNOSIS — H01002 Unspecified blepharitis right lower eyelid: Secondary | ICD-10-CM | POA: Diagnosis not present

## 2023-07-23 DIAGNOSIS — H01005 Unspecified blepharitis left lower eyelid: Secondary | ICD-10-CM | POA: Diagnosis not present

## 2023-08-10 DIAGNOSIS — D242 Benign neoplasm of left breast: Secondary | ICD-10-CM | POA: Diagnosis not present

## 2023-08-10 DIAGNOSIS — Z803 Family history of malignant neoplasm of breast: Secondary | ICD-10-CM | POA: Diagnosis not present

## 2023-08-11 DIAGNOSIS — T781XXA Other adverse food reactions, not elsewhere classified, initial encounter: Secondary | ICD-10-CM | POA: Diagnosis not present

## 2023-08-11 DIAGNOSIS — J300 Vasomotor rhinitis: Secondary | ICD-10-CM | POA: Diagnosis not present

## 2023-10-12 DIAGNOSIS — E119 Type 2 diabetes mellitus without complications: Secondary | ICD-10-CM | POA: Diagnosis not present

## 2023-10-12 DIAGNOSIS — R0981 Nasal congestion: Secondary | ICD-10-CM | POA: Diagnosis not present

## 2023-10-12 DIAGNOSIS — R058 Other specified cough: Secondary | ICD-10-CM | POA: Diagnosis not present

## 2023-10-12 DIAGNOSIS — G4730 Sleep apnea, unspecified: Secondary | ICD-10-CM | POA: Diagnosis not present

## 2023-11-01 DIAGNOSIS — G4730 Sleep apnea, unspecified: Secondary | ICD-10-CM | POA: Diagnosis not present

## 2023-11-01 DIAGNOSIS — R6883 Chills (without fever): Secondary | ICD-10-CM | POA: Diagnosis not present

## 2023-11-01 DIAGNOSIS — E119 Type 2 diabetes mellitus without complications: Secondary | ICD-10-CM | POA: Diagnosis not present

## 2023-11-01 DIAGNOSIS — Z1152 Encounter for screening for COVID-19: Secondary | ICD-10-CM | POA: Diagnosis not present

## 2023-11-01 DIAGNOSIS — R5383 Other fatigue: Secondary | ICD-10-CM | POA: Diagnosis not present

## 2023-11-01 DIAGNOSIS — R0981 Nasal congestion: Secondary | ICD-10-CM | POA: Diagnosis not present

## 2023-11-01 DIAGNOSIS — R058 Other specified cough: Secondary | ICD-10-CM | POA: Diagnosis not present

## 2023-11-11 DIAGNOSIS — M542 Cervicalgia: Secondary | ICD-10-CM | POA: Diagnosis not present

## 2023-11-11 DIAGNOSIS — H9201 Otalgia, right ear: Secondary | ICD-10-CM | POA: Diagnosis not present

## 2023-11-19 DIAGNOSIS — H9201 Otalgia, right ear: Secondary | ICD-10-CM | POA: Diagnosis not present

## 2023-11-19 DIAGNOSIS — R519 Headache, unspecified: Secondary | ICD-10-CM | POA: Diagnosis not present

## 2024-01-04 DIAGNOSIS — R519 Headache, unspecified: Secondary | ICD-10-CM | POA: Diagnosis not present

## 2024-01-25 DIAGNOSIS — Z803 Family history of malignant neoplasm of breast: Secondary | ICD-10-CM | POA: Diagnosis not present

## 2024-01-25 DIAGNOSIS — D242 Benign neoplasm of left breast: Secondary | ICD-10-CM | POA: Diagnosis not present

## 2024-01-25 DIAGNOSIS — N6323 Unspecified lump in the left breast, lower outer quadrant: Secondary | ICD-10-CM | POA: Diagnosis not present

## 2024-02-11 DIAGNOSIS — E559 Vitamin D deficiency, unspecified: Secondary | ICD-10-CM | POA: Diagnosis not present

## 2024-02-11 DIAGNOSIS — E785 Hyperlipidemia, unspecified: Secondary | ICD-10-CM | POA: Diagnosis not present

## 2024-02-11 DIAGNOSIS — F418 Other specified anxiety disorders: Secondary | ICD-10-CM | POA: Diagnosis not present

## 2024-02-11 DIAGNOSIS — D126 Benign neoplasm of colon, unspecified: Secondary | ICD-10-CM | POA: Diagnosis not present

## 2024-02-11 DIAGNOSIS — D4860 Neoplasm of uncertain behavior of unspecified breast: Secondary | ICD-10-CM | POA: Diagnosis not present

## 2024-02-11 DIAGNOSIS — K7689 Other specified diseases of liver: Secondary | ICD-10-CM | POA: Diagnosis not present

## 2024-02-11 DIAGNOSIS — R7302 Impaired glucose tolerance (oral): Secondary | ICD-10-CM | POA: Diagnosis not present

## 2024-02-11 DIAGNOSIS — E890 Postprocedural hypothyroidism: Secondary | ICD-10-CM | POA: Diagnosis not present

## 2024-02-11 DIAGNOSIS — I5189 Other ill-defined heart diseases: Secondary | ICD-10-CM | POA: Diagnosis not present

## 2024-02-11 DIAGNOSIS — K760 Fatty (change of) liver, not elsewhere classified: Secondary | ICD-10-CM | POA: Diagnosis not present

## 2024-02-11 DIAGNOSIS — G4730 Sleep apnea, unspecified: Secondary | ICD-10-CM | POA: Diagnosis not present

## 2024-02-11 DIAGNOSIS — E669 Obesity, unspecified: Secondary | ICD-10-CM | POA: Diagnosis not present

## 2024-06-28 DIAGNOSIS — L280 Lichen simplex chronicus: Secondary | ICD-10-CM | POA: Diagnosis not present

## 2024-06-28 DIAGNOSIS — L718 Other rosacea: Secondary | ICD-10-CM | POA: Diagnosis not present

## 2024-08-11 NOTE — Progress Notes (Addendum)
 Davita Sublett                                          MRN: [id number]   08/14/2024   The [facility] Quality Team Specialist reviewed this patient medical record for the purposes of chart review for care gap closure. The following were reviewed: chart review for care gap closure-glycemic status assessment.    [facility] Quality Team
